# Patient Record
Sex: Female | Born: 1995 | Race: White | Hispanic: No | Marital: Married | Smoking: Former smoker
Health system: Southern US, Community
[De-identification: ages and names within clinical notes are randomized; demographics above are authoritative.]

## PROBLEM LIST (undated history)

## (undated) ENCOUNTER — Inpatient Hospital Stay (HOSPITAL_COMMUNITY): Payer: Self-pay

## (undated) DIAGNOSIS — N926 Irregular menstruation, unspecified: Secondary | ICD-10-CM

## (undated) DIAGNOSIS — O139 Gestational [pregnancy-induced] hypertension without significant proteinuria, unspecified trimester: Secondary | ICD-10-CM

## (undated) DIAGNOSIS — Z789 Other specified health status: Secondary | ICD-10-CM

## (undated) DIAGNOSIS — Z309 Encounter for contraceptive management, unspecified: Secondary | ICD-10-CM

## (undated) DIAGNOSIS — O039 Complete or unspecified spontaneous abortion without complication: Secondary | ICD-10-CM

## (undated) HISTORY — DX: Irregular menstruation, unspecified: N92.6

## (undated) HISTORY — PX: WISDOM TOOTH EXTRACTION: SHX21

## (undated) HISTORY — DX: Gestational (pregnancy-induced) hypertension without significant proteinuria, unspecified trimester: O13.9

## (undated) HISTORY — DX: Complete or unspecified spontaneous abortion without complication: O03.9

## (undated) HISTORY — DX: Other specified health status: Z78.9

## (undated) HISTORY — DX: Encounter for contraceptive management, unspecified: Z30.9

---
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
## 2016-04-20 ENCOUNTER — Ambulatory Visit (INDEPENDENT_AMBULATORY_CARE_PROVIDER_SITE_OTHER): Payer: Self-pay | Admitting: Adult Health

## 2016-04-20 ENCOUNTER — Encounter: Payer: Self-pay | Admitting: Adult Health

## 2016-04-20 VITALS — BP 116/57 | HR 85 | Ht 63.0 in | Wt 141.0 lb

## 2016-04-20 DIAGNOSIS — Z3202 Encounter for pregnancy test, result negative: Secondary | ICD-10-CM

## 2016-04-20 DIAGNOSIS — Z30011 Encounter for initial prescription of contraceptive pills: Secondary | ICD-10-CM

## 2016-04-20 LAB — POCT URINE PREGNANCY: PREG TEST UR: NEGATIVE

## 2016-04-20 MED ORDER — NORGESTIMATE-ETH ESTRADIOL 0.25-35 MG-MCG PO TABS: 1.0000 | ORAL_TABLET | Freq: Every day | ORAL | 11 refills | Status: DC

## 2016-04-20 NOTE — Patient Instructions (Signed)
Start OCs with next period Use condoms Pap and physical in 1 year

## 2016-04-20 NOTE — Progress Notes (Signed)
Subjective:     Patient ID: Erin Watson, female   DOB: [birth year], 21 y.o.   MRN: [id number]  HPI Duwayne HeckDanielle is a 21 year old white female in requesting to get on birth control pills, no complaints, is self pay.  Review of Systems Patient denies any headaches, hearing loss, fatigue, blurred vision, shortness of breath, chest pain, abdominal pain, problems with bowel movements, urination, or intercourse. No joint pain or mood swings. Reviewed past medical,surgical, social and family history. Reviewed medications and allergies.     Objective:   Physical Exam BP (!) 116/57 (BP Location: Left Arm, Patient Position: Sitting, Cuff Size: Small)   Pulse 85   Ht 5\' 3"  (1.6 m)   Wt 141 lb (64 kg)   LMP 03/29/2016   BMI 24.98 kg/m UPT negative, Skin warm and dry. Lungs: clear to ausculation bilaterally. Cardiovascular: regular rate and rhythm.   PHQ 9 score 5, she says she is not depressed just off days occasionally, denies any suicidal ideations.  Assessment:     1. Encounter for initial prescription of contraceptive pills   2. Pregnancy examination or test, negative result       Plan:     Meds ordered this encounter  Medications  . norgestimate-ethinyl estradiol (ORTHO-CYCLEN,SPRINTEC,PREVIFEM) 0.25-35 MG-MCG tablet    Sig: Take 1 tablet by mouth daily.    Dispense:  1 Package    Refill:  11    Order Specific Question:   Supervising Provider    Answer:   Lazaro ArmsEURE, LUTHER H [[id number]]  Use condoms Start OCs with next period Pap and physical in 1 year Try to get family planning medicaid

## 2017-08-15 ENCOUNTER — Other Ambulatory Visit: Payer: Self-pay

## 2017-08-15 ENCOUNTER — Emergency Department (HOSPITAL_COMMUNITY)
Admission: EM | Admit: 2017-08-15 | Discharge: 2017-08-15 | Disposition: A | Discharge status: Home / Self Care | Payer: Self-pay | Attending: Emergency Medicine | Admitting: Emergency Medicine

## 2017-08-15 ENCOUNTER — Emergency Department (HOSPITAL_COMMUNITY): Payer: Self-pay

## 2017-08-15 ENCOUNTER — Encounter (HOSPITAL_COMMUNITY): Payer: Self-pay | Admitting: Emergency Medicine

## 2017-08-15 DIAGNOSIS — R1031 Right lower quadrant pain: Secondary | ICD-10-CM

## 2017-08-15 DIAGNOSIS — N390 Urinary tract infection, site not specified: Secondary | ICD-10-CM

## 2017-08-15 DIAGNOSIS — N83201 Unspecified ovarian cyst, right side: Secondary | ICD-10-CM | POA: Insufficient documentation

## 2017-08-15 DIAGNOSIS — N83209 Unspecified ovarian cyst, unspecified side: Secondary | ICD-10-CM

## 2017-08-15 DIAGNOSIS — O3481 Maternal care for other abnormalities of pelvic organs, first trimester: Secondary | ICD-10-CM | POA: Insufficient documentation

## 2017-08-15 DIAGNOSIS — Z87891 Personal history of nicotine dependence: Secondary | ICD-10-CM | POA: Insufficient documentation

## 2017-08-15 DIAGNOSIS — O2341 Unspecified infection of urinary tract in pregnancy, first trimester: Secondary | ICD-10-CM | POA: Insufficient documentation

## 2017-08-15 DIAGNOSIS — Z3A10 10 weeks gestation of pregnancy: Secondary | ICD-10-CM

## 2017-08-15 LAB — CBC WITH DIFFERENTIAL/PLATELET
BASOS ABS: 0 10*3/uL (ref 0.0–0.1)
BASOS PCT: 0 %
Eosinophils Absolute: 0.1 10*3/uL (ref 0.0–0.7)
Eosinophils Relative: 1 %
HEMATOCRIT: 41.4 % (ref 36.0–46.0)
HEMOGLOBIN: 14 g/dL (ref 12.0–15.0)
Lymphocytes Relative: 19 %
Lymphs Abs: 1.5 10*3/uL (ref 0.7–4.0)
MCH: 29.4 pg (ref 26.0–34.0)
MCHC: 33.8 g/dL (ref 30.0–36.0)
MCV: 87 fL (ref 78.0–100.0)
Monocytes Absolute: 0.4 10*3/uL (ref 0.1–1.0)
Monocytes Relative: 5 %
Neutro Abs: 5.9 10*3/uL (ref 1.7–7.7)
Neutrophils Relative %: 75 %
Platelets: 211 10*3/uL (ref 150–400)
RBC: 4.76 MIL/uL (ref 3.87–5.11)
RDW: 12.4 % (ref 11.5–15.5)
WBC: 7.9 10*3/uL (ref 4.0–10.5)

## 2017-08-15 LAB — BASIC METABOLIC PANEL
Anion gap: 8 (ref 5–15)
BUN: 9 mg/dL (ref 6–20)
CHLORIDE: 103 mmol/L (ref 101–111)
CO2: 25 mmol/L (ref 22–32)
Calcium: 9.3 mg/dL (ref 8.9–10.3)
Creatinine, Ser: 0.53 mg/dL (ref 0.44–1.00)
GFR calc Af Amer: >60 mL/min (ref >60)
GFR calc non Af Amer: >60 mL/min (ref >60)
GLUCOSE: 94 mg/dL (ref 65–99)
POTASSIUM: 3.8 mmol/L (ref 3.5–5.1)
Sodium: 136 mmol/L (ref 135–145)

## 2017-08-15 LAB — HCG, QUANTITATIVE, PREGNANCY: HCG, BETA CHAIN, QUANT, S: 133751 m[IU]/mL — AB (ref <5)

## 2017-08-15 LAB — URINALYSIS, ROUTINE W REFLEX MICROSCOPIC
Bilirubin Urine: NEGATIVE
Glucose, UA: NEGATIVE mg/dL
KETONES UR: NEGATIVE mg/dL
Nitrite: NEGATIVE
PROTEIN: NEGATIVE mg/dL
Specific Gravity, Urine: 1.021 (ref 1.005–1.030)
pH: 5 (ref 5.0–8.0)

## 2017-08-15 MED ORDER — CEPHALEXIN 500 MG PO CAPS: 500.0000 mg | ORAL_CAPSULE | Freq: Four times a day (QID) | ORAL | 0 refills | Status: DC

## 2017-08-15 NOTE — ED Provider Notes (Signed)
[facility] EMERGENCY DEPARTMENT Provider Note   CSN: [id number] Arrival date & time: 08/15/17  4098     History   Chief Complaint Chief Complaint  Patient presents with  . Abdominal Pain    HPI Erin Watson is a 22 y.o. female.  The history is provided by the patient. No language interpreter was used.  Abdominal Pain   This is a new problem. The current episode started yesterday. The problem occurs constantly. The problem has been rapidly worsening. The pain is located in the RLQ. The quality of the pain is aching. The pain is moderate. Associated symptoms include nausea. Nothing aggravates the symptoms. Nothing relieves the symptoms. Past workup does not include surgery. Her past medical history does not include irritable bowel syndrome.   Pt reports she has soreness in her right lower abdomen.  Pt is [redacted] weeks pregnant.  She is on prenatal vitamins  Past Medical History:  Diagnosis Date  . Contraceptive management 12/28/2013  . Irregular bleeding 12/28/2013  . Medical history non-contributory   . Miscarriage     Patient Active Problem List   Diagnosis Date Noted  . Irregular bleeding 12/28/2013  . Contraceptive management 12/28/2013  . Complete miscarriage 07/05/2013  . Spontaneous miscarriage 07/05/2013  . Rubella non-immune status, antepartum 06/18/2013  . Vaginal bleeding in pregnant patient at less than [redacted] weeks gestation 06/18/2013  . Supervision of normal first teen pregnancy 05/08/2013  . Teen pregnancy 05/08/2013    Past Surgical History:  Procedure Laterality Date  . NO PAST SURGERIES       OB History    Gravida  2   Para  0   Term      Preterm      AB  1   Living  0     SAB  1   TAB      Ectopic      Multiple      Live Births               Home Medications    Prior to Admission medications   Medication Sig Start Date End Date Taking? Authorizing Provider  Prenatal Vit-Fe Fumarate-FA (MULTIVITAMIN-PRENATAL) 27-0.8 MG TABS  tablet Take 1 tablet by mouth daily at 12 noon.   Yes [provider]  cephALEXin (KEFLEX) 500 MG capsule Take 1 capsule (500 mg total) by mouth 4 (four) times daily. 08/15/17   Elson Areas, PA-C    Family History Family History  Problem Relation Age of Onset  . Hypertension Father   . Cancer Maternal Aunt        skin  . Diabetes Maternal Grandmother   . Diabetes Paternal Grandmother   . Heart disease Paternal Grandmother   . Cancer Paternal Grandfather     Social History Social History   Tobacco Use  . Smoking status: Former Smoker    Types: Cigarettes  . Smokeless tobacco: Never Used  Substance Use Topics  . Alcohol use: No  . Drug use: No     Allergies   Patient has no known allergies.   Review of Systems Review of Systems  Gastrointestinal: Positive for abdominal pain and nausea.  All other systems reviewed and are negative.    Physical Exam Updated Vital Signs BP 128/66   Pulse 87   Temp 98 F (36.7 C)   Resp 18   Ht  (1.6 m)   Wt 59 kg (130 lb)   SpO2 100%   BMI 23.03  kg/m   Physical Exam  Constitutional: She appears well-developed and well-nourished.  HENT:  Head: Normocephalic.  Mouth/Throat: Oropharynx is clear and moist.  Eyes: Pupils are equal, round, and reactive to light. EOM are normal.  Cardiovascular: Normal rate, regular rhythm and normal heart sounds.  Pulmonary/Chest: Effort normal and breath sounds normal.  Abdominal: Normal appearance and bowel sounds are normal.  Neurological: She is alert.  Skin: Skin is warm.  Psychiatric: She has a normal mood and affect.  Nursing note and vitals reviewed.    ED Treatments / Results  Labs (all labs ordered are listed, but only abnormal results are displayed) Labs Reviewed  URINALYSIS, ROUTINE W REFLEX MICROSCOPIC - Abnormal; Notable for the following components:      Result Value   APPearance CLOUDY (*)    Hgb urine dipstick MODERATE (*)    Leukocytes, UA LARGE (*)      Bacteria, UA RARE (*)    All other components within normal limits  HCG, QUANTITATIVE, PREGNANCY - Abnormal; Notable for the following components:   hCG, Beta Chain, Quant, S 133,751 (*)    All other components within normal limits  CBC WITH DIFFERENTIAL/PLATELET  BASIC METABOLIC PANEL    EKG None  Radiology US Ob Comp Less 14 Wks  Result Date: 08/15/2017 CLINICAL DATA:  Right lower quadrant pain EXAM: OBSTETRIC <14 WK [place] AND TRANSVAGINAL OB US TECHNIQUE: Both transabdominal and transvaginal ultrasound examinations were performed for complete evaluation of the gestation as well as the maternal uterus, adnexal regions, and pelvic cul-de-sac. Transvaginal technique was performed to assess early pregnancy. COMPARISON:  None. FINDINGS: Intrauterine gestational sac: Single Yolk sac:  Visualized. Embryo:  Visualized. Cardiac Activity: Visualized. Heart Rate: 163 bpm MSD:   mm    w     d CRL:  31.2 mm   10 w   0 d                  [place] EDC: March 13, 2018 Subchorionic hemorrhage:  None visualized. Maternal uterus/adnexae: Corpus luteum cyst on the right. Left ovary not visualized. IMPRESSION: 1. Single live IUP. 2. Corpus luteum cyst in the right ovary. Electronically Signed   By: Gerome Sam III M.D   On: 08/15/2017 10:13   US Ob Transvaginal  Result Date: 08/15/2017 CLINICAL DATA:  Right lower quadrant pain EXAM: OBSTETRIC <14 WK [place] AND TRANSVAGINAL OB US TECHNIQUE: Both transabdominal and transvaginal ultrasound examinations were performed for complete evaluation of the gestation as well as the maternal uterus, adnexal regions, and pelvic cul-de-sac. Transvaginal technique was performed to assess early pregnancy. COMPARISON:  None. FINDINGS: Intrauterine gestational sac: Single Yolk sac:  Visualized. Embryo:  Visualized. Cardiac Activity: Visualized. Heart Rate: 163 bpm MSD:   mm    w     d CRL:  31.2 mm   10 w   0 d                  [place] EDC: March 13, 2018 Subchorionic hemorrhage:  None  visualized. Maternal uterus/adnexae: Corpus luteum cyst on the right. Left ovary not visualized. IMPRESSION: 1. Single live IUP. 2. Corpus luteum cyst in the right ovary. Electronically Signed   By: Gerome Sam III M.D   On: 08/15/2017 10:13    Procedures Procedures (including critical care time)  Medications Ordered in ED Medications - No data to display   Initial Impression / Assessment and Plan / ED Course  I have reviewed the triage vital signs  and the nursing notes.  Pertinent labs & imaging results that were available during my care of the patient were reviewed by me and considered in my medical decision making (see chart for details).     MDM  Pt counseled on possible urinary tract infection. Pt placed on keflex.  Ultrasound shows iup with ovarian cyst.  Pt counseled on cyst  Final Clinical Impressions(s) / ED Diagnoses   Final diagnoses:  [redacted] weeks gestation of pregnancy  Urinary tract infection without hematuria, site unspecified  Ovarian cyst during pregnancy in first trimester    ED Discharge Orders        Ordered    cephALEXin (KEFLEX) 500 MG capsule  4 times daily     08/15/17 1038    An After Visit Summary was printed and given to the patient.    Elson Areas, Cordelia Poche 08/15/17 1157    Donnetta Hutching, MD 08/15/17 1515

## 2017-08-15 NOTE — ED Triage Notes (Signed)
Pt state she was using the bathroom last night and felt a "pulling" sensation in her lower abdomen. Pt is [redacted] wks pregnant.  Denies vaginal bleeding or discharge

## 2017-08-15 NOTE — Discharge Instructions (Signed)
Follow up with your Physician for recheck  

## 2017-08-15 NOTE — ED Notes (Signed)
Patient transported to Ultrasound 

## 2017-08-22 ENCOUNTER — Encounter: Payer: Self-pay | Admitting: Women's Health

## 2017-08-22 ENCOUNTER — Ambulatory Visit (INDEPENDENT_AMBULATORY_CARE_PROVIDER_SITE_OTHER): Payer: Self-pay | Admitting: Women's Health

## 2017-08-22 VITALS — BP 110/80 | HR 98 | Temp 98.3°F | Wt 129.0 lb

## 2017-08-22 DIAGNOSIS — Z331 Pregnant state, incidental: Secondary | ICD-10-CM

## 2017-08-22 DIAGNOSIS — O2341 Unspecified infection of urinary tract in pregnancy, first trimester: Secondary | ICD-10-CM

## 2017-08-22 DIAGNOSIS — O2301 Infections of kidney in pregnancy, first trimester: Secondary | ICD-10-CM | POA: Insufficient documentation

## 2017-08-22 DIAGNOSIS — Z1389 Encounter for screening for other disorder: Secondary | ICD-10-CM

## 2017-08-22 DIAGNOSIS — N390 Urinary tract infection, site not specified: Secondary | ICD-10-CM

## 2017-08-22 LAB — POCT URINALYSIS DIPSTICK
GLUCOSE UA: NEGATIVE
Ketones, UA: NEGATIVE
Leukocytes, UA: NEGATIVE
Nitrite, UA: NEGATIVE
Protein, UA: NEGATIVE

## 2017-08-22 NOTE — Progress Notes (Signed)
   GYN VISIT Patient name: Erin Watson MRN [id number]  Date of birth: January 10, 1996 Chief Complaint:   follow-up ER (has uti/ ovarian cyst/ taking keflex)  History of Present Illness:   Erin Watson is a 22 y.o. G26P0010 Caucasian female at [redacted]w[redacted]d being seen today for f/u from ED for UTI. Tx for presumed UTI at [facility] on 08/15/17, rx'd keflex x 10d, no urine cx sent. U/S 08/15/17: CRL c/w [redacted]w[redacted]d w/ Rt CL cyst. Feeling better, still some mild Rt back pain. Denies fever/chills, n/v, etc. Taking pnv daily. Some heartburn. Doesn't have new ob visit scheduled yet, currently self-pay, didn't qualify for preg [facility]. Denies any other meds, etoh, illicit drugs, etc.   No LMP recorded. Patient is pregnant. Review of Systems:   Pertinent items are noted in HPI Denies fever/chills, dizziness, headaches, visual disturbances, fatigue, shortness of breath, chest pain, abdominal pain, vomiting, abnormal vaginal discharge/itching/odor/irritation, problems with periods, bowel movements, urination, or intercourse unless otherwise stated above.  Pertinent History Reviewed:  Reviewed past medical,surgical, social, obstetrical and family history.  Reviewed problem list, medications and allergies. Physical Assessment:   Vitals:   08/22/17 0942  BP: 110/80  Pulse: 98  Temp: 98.3 F (36.8 C)  Weight: 129 lb (58.5 kg)  Body mass index is 22.85 kg/m.       Physical Examination:   General appearance: alert, well appearing, and in no distress  Mental status: alert, oriented to person, place, and time  Skin: warm & dry   Cardiovascular: normal heart rate noted  Respiratory: normal respiratory effort, no distress  Abdomen: soft, non-tender   Back: mild Rt CVAT   Pelvic: normal external genitalia, vulva, vagina, cervix, uterus and adnexa, examination not indicated  Extremities: no edema   Results for orders placed or performed in visit on 08/22/17 (from the past 24 hour(s))  POCT urinalysis dipstick   Collection  Time: 08/22/17  9:48 AM  Result Value Ref Range   Color, UA     Clarity, UA     Glucose, UA neg    Bilirubin, UA     Ketones, UA neg    Spec Grav, UA  1.010 - 1.025   Blood, UA small    pH, UA  5.0 - 8.0   Protein, UA neg    Urobilinogen, UA  0.2 or 1.0 E.U./dL   Nitrite, UA neg    Leukocytes, UA Negative Negative   Appearance     Odor      Assessment & Plan:  1) Resolving presumed Rt pyelonephritis> afebrile, appears well, continue keflex as rx'd, send urine cx today, discussed warning s/s, reasons to seek care  Meds: No orders of the defined types were placed in this encounter.   Orders Placed This Encounter  Procedures  . Urine Culture  . POCT urinalysis dipstick    Return for needs new ob visit scheduled.  Cheral Marker CNM, [facility] 08/22/2017 10:05 AM

## 2017-08-22 NOTE — Patient Instructions (Signed)
Erin Watson, I greatly value your feedback.  If you receive a survey following your visit with [place] today, we appreciate you taking the time to fill it out.  Thanks, Joellyn Haff, CNM, WHNP-BC   Nausea & Vomiting  Have saltine crackers or pretzels by your bed and eat a few bites before you raise your head out of bed in the morning  Eat small frequent meals throughout the day instead of large meals  Drink plenty of fluids throughout the day to stay hydrated, just don't drink a lot of fluids with your meals.  This can make your stomach fill up faster making you feel sick  Do not brush your teeth right after you eat  Products with real ginger are good for nausea, like ginger ale and ginger hard candy Make sure it says made with real ginger!  Sucking on sour candy like lemon heads is also good for nausea  If your prenatal vitamins make you nauseated, take them at night so you will sleep through the nausea  Sea Bands  If you feel like you need medicine for the nausea & vomiting please let us know  If you are unable to keep any fluids or food down please let us know   Constipation  Drink plenty of fluid, preferably water, throughout the day  Eat foods high in fiber such as fruits, vegetables, and grains  Exercise, such as walking, is a good way to keep your bowels regular  Drink warm fluids, especially warm prune juice, or decaf coffee  Eat a 1/2 cup of real oatmeal (not instant), 1/2 cup applesauce, and 1/2-1 cup warm prune juice every day  If needed, you may take Colace (docusate sodium) stool softener once or twice a day to help keep the stool soft. If you are pregnant, wait until you are out of your first trimester (12-14 weeks of pregnancy)  If you still are having problems with constipation, you may take Miralax once daily as needed to help keep your bowels regular.  If you are pregnant, wait until you are out of your first trimester (12-14 weeks of pregnancy)   First  Trimester of Pregnancy The first trimester of pregnancy is from week 1 until the end of week 12 (months 1 through 3). A week after a sperm fertilizes an egg, the egg will implant on the wall of the uterus. This embryo will begin to develop into a baby. Genes from you and your partner are forming the baby. The female genes determine whether the baby is a boy or a girl. At 6-8 weeks, the eyes and face are formed, and the heartbeat can be seen on ultrasound. At the end of 12 weeks, all the baby's organs are formed.  Now that you are pregnant, you will want to do everything you can to have a healthy baby. Two of the most important things are to get good prenatal care and to follow your health care provider's instructions. Prenatal care is all the medical care you receive before the baby's birth. This care will help prevent, find, and treat any problems during the pregnancy and childbirth. BODY CHANGES Your body goes through many changes during pregnancy. The changes vary from woman to woman.   You may gain or lose a couple of pounds at first.  You may feel sick to your stomach (nauseous) and throw up (vomit). If the vomiting is uncontrollable, call your health care provider.  You may tire easily.  You may develop headaches  that can be relieved by medicines approved by your health care provider.  You may urinate more often. Painful urination may mean you have a bladder infection.  You may develop heartburn as a result of your pregnancy.  You may develop constipation because certain hormones are causing the muscles that push waste through your intestines to slow down.  You may develop hemorrhoids or swollen, bulging veins (varicose veins).  Your breasts may begin to grow larger and become tender. Your nipples may stick out more, and the tissue that surrounds them (areola) may become darker.  Your gums may bleed and may be sensitive to brushing and flossing.  Dark spots or blotches (chloasma, mask  of pregnancy) may develop on your face. This will likely fade after the baby is born.  Your menstrual periods will stop.  You may have a loss of appetite.  You may develop cravings for certain kinds of food.  You may have changes in your emotions from day to day, such as being excited to be pregnant or being concerned that something may go wrong with the pregnancy and baby.  You may have more vivid and strange dreams.  You may have changes in your hair. These can include thickening of your hair, rapid growth, and changes in texture. Some women also have hair loss during or after pregnancy, or hair that feels dry or thin. Your hair will most likely return to normal after your baby is born. WHAT TO EXPECT AT YOUR PRENATAL VISITS During a routine prenatal visit:  You will be weighed to make sure you and the baby are growing normally.  Your blood pressure will be taken.  Your abdomen will be measured to track your baby's growth.  The fetal heartbeat will be listened to starting around week 10 or 12 of your pregnancy.  Test results from any previous visits will be discussed. Your health care provider may ask you:  How you are feeling.  If you are feeling the baby move.  If you have had any abnormal symptoms, such as leaking fluid, bleeding, severe headaches, or abdominal cramping.  If you have any questions. Other tests that may be performed during your first trimester include:  Blood tests to find your blood type and to check for the presence of any previous infections. They will also be used to check for low iron levels (anemia) and Rh antibodies. Later in the pregnancy, blood tests for diabetes will be done along with other tests if problems develop.  Urine tests to check for infections, diabetes, or protein in the urine.  An ultrasound to confirm the proper growth and development of the baby.  An amniocentesis to check for possible genetic problems.  Fetal screens for spina  bifida and Down syndrome.  You may need other tests to make sure you and the baby are doing well. HOME CARE INSTRUCTIONS  Medicines  Follow your health care provider's instructions regarding medicine use. Specific medicines may be either safe or unsafe to take during pregnancy.  Take your prenatal vitamins as directed.  If you develop constipation, try taking a stool softener if your health care provider approves. Diet  Eat regular, well-balanced meals. Choose a variety of foods, such as meat or vegetable-based protein, fish, milk and low-fat dairy products, vegetables, fruits, and whole grain breads and cereals. Your health care provider will help you determine the amount of weight gain that is right for you.  Avoid raw meat and uncooked cheese. These carry germs that can  cause birth defects in the baby.  Eating four or five small meals rather than three large meals a day may help relieve nausea and vomiting. If you start to feel nauseous, eating a few soda crackers can be helpful. Drinking liquids between meals instead of during meals also seems to help nausea and vomiting.  If you develop constipation, eat more high-fiber foods, such as fresh vegetables or fruit and whole grains. Drink enough fluids to keep your urine clear or pale yellow. Activity and Exercise  Exercise only as directed by your health care provider. Exercising will help you:  Control your weight.  Stay in shape.  Be prepared for labor and delivery.  Experiencing pain or cramping in the lower abdomen or low back is a good sign that you should stop exercising. Check with your health care provider before continuing normal exercises.  Try to avoid standing for long periods of time. Move your legs often if you must stand in one place for a long time.  Avoid heavy lifting.  Wear low-heeled shoes, and practice good posture.  You may continue to have sex unless your health care provider directs you  otherwise. Relief of Pain or Discomfort  Wear a good support bra for breast tenderness.   Take warm sitz baths to soothe any pain or discomfort caused by hemorrhoids. Use hemorrhoid cream if your health care provider approves.   Rest with your legs elevated if you have leg cramps or low back pain.  If you develop varicose veins in your legs, wear support hose. Elevate your feet for 15 minutes, 3-4 times a day. Limit salt in your diet. Prenatal Care  Schedule your prenatal visits by the twelfth week of pregnancy. They are usually scheduled monthly at first, then more often in the last 2 months before delivery.  Write down your questions. Take them to your prenatal visits.  Keep all your prenatal visits as directed by your health care provider. Safety  Wear your seat belt at all times when driving.  Make a list of emergency phone numbers, including numbers for family, friends, the hospital, and police and fire departments. General Tips  Ask your health care provider for a referral to a local prenatal education class. Begin classes no later than at the beginning of month 6 of your pregnancy.  Ask for help if you have counseling or nutritional needs during pregnancy. Your health care provider can offer advice or refer you to specialists for help with various needs.  Do not use hot tubs, steam rooms, or saunas.  Do not douche or use tampons or scented sanitary pads.  Do not cross your legs for long periods of time.  Avoid cat litter boxes and soil used by cats. These carry germs that can cause birth defects in the baby and possibly loss of the fetus by miscarriage or stillbirth.  Avoid all smoking, herbs, alcohol, and medicines not prescribed by your health care provider. Chemicals in these affect the formation and growth of the baby.  Schedule a dentist appointment. At home, brush your teeth with a soft toothbrush and be gentle when you floss. SEEK MEDICAL CARE IF:   You have  dizziness.  You have mild pelvic cramps, pelvic pressure, or nagging pain in the abdominal area.  You have persistent nausea, vomiting, or diarrhea.  You have a bad smelling vaginal discharge.  You have pain with urination.  You notice increased swelling in your face, hands, legs, or ankles. SEEK IMMEDIATE MEDICAL CARE IF:  You have a fever.  You are leaking fluid from your vagina.  You have spotting or bleeding from your vagina.  You have severe abdominal cramping or pain.  You have rapid weight gain or loss.  You vomit blood or material that looks like coffee grounds.  You are exposed to [place] measles and have never had them.  You are exposed to fifth disease or chickenpox.  You develop a severe headache.  You have shortness of breath.  You have any kind of trauma, such as from a fall or a car accident. Document Released: 03/23/2001 Document Revised: 08/13/2013 Document Reviewed: 02/06/2013 [facility] Patient Information 2015 [place], [place]. This information is not intended to replace advice given to you by your health care provider. Make sure you discuss any questions you have with your health care provider.

## 2017-08-24 LAB — URINE CULTURE: Organism ID, Bacteria: NO GROWTH

## 2017-09-08 ENCOUNTER — Ambulatory Visit (INDEPENDENT_AMBULATORY_CARE_PROVIDER_SITE_OTHER): Payer: Self-pay | Admitting: Advanced Practice Midwife

## 2017-09-08 ENCOUNTER — Ambulatory Visit: Payer: Self-pay | Admitting: *Deleted

## 2017-09-08 ENCOUNTER — Other Ambulatory Visit (HOSPITAL_COMMUNITY)
Admission: RE | Admit: 2017-09-08 | Discharge: 2017-09-08 | Disposition: A | Discharge status: Home / Self Care | Payer: Medicaid Other | Source: Ambulatory Visit | Attending: Advanced Practice Midwife | Admitting: Advanced Practice Midwife

## 2017-09-08 ENCOUNTER — Encounter: Payer: Self-pay | Admitting: Advanced Practice Midwife

## 2017-09-08 VITALS — BP 107/67 | HR 67 | Wt 125.0 lb

## 2017-09-08 DIAGNOSIS — Z3480 Encounter for supervision of other normal pregnancy, unspecified trimester: Secondary | ICD-10-CM | POA: Insufficient documentation

## 2017-09-08 DIAGNOSIS — O09291 Supervision of pregnancy with other poor reproductive or obstetric history, first trimester: Secondary | ICD-10-CM

## 2017-09-08 DIAGNOSIS — O0990 Supervision of high risk pregnancy, unspecified, unspecified trimester: Secondary | ICD-10-CM | POA: Insufficient documentation

## 2017-09-08 DIAGNOSIS — Z789 Other specified health status: Secondary | ICD-10-CM

## 2017-09-08 DIAGNOSIS — Z331 Pregnant state, incidental: Secondary | ICD-10-CM

## 2017-09-08 DIAGNOSIS — Z1389 Encounter for screening for other disorder: Secondary | ICD-10-CM

## 2017-09-08 DIAGNOSIS — Z3482 Encounter for supervision of other normal pregnancy, second trimester: Secondary | ICD-10-CM

## 2017-09-08 DIAGNOSIS — Z3A13 13 weeks gestation of pregnancy: Secondary | ICD-10-CM

## 2017-09-08 DIAGNOSIS — Z3481 Encounter for supervision of other normal pregnancy, first trimester: Secondary | ICD-10-CM

## 2017-09-08 DIAGNOSIS — Z363 Encounter for antenatal screening for malformations: Secondary | ICD-10-CM

## 2017-09-08 DIAGNOSIS — O2301 Infections of kidney in pregnancy, first trimester: Secondary | ICD-10-CM

## 2017-09-08 DIAGNOSIS — N12 Tubulo-interstitial nephritis, not specified as acute or chronic: Secondary | ICD-10-CM

## 2017-09-08 DIAGNOSIS — Z124 Encounter for screening for malignant neoplasm of cervix: Secondary | ICD-10-CM

## 2017-09-08 LAB — POCT URINALYSIS DIPSTICK
Blood, UA: NEGATIVE
Glucose, UA: NEGATIVE
Ketones, UA: NEGATIVE
Leukocytes, UA: NEGATIVE
Nitrite, UA: NEGATIVE
PROTEIN UA: NEGATIVE

## 2017-09-08 NOTE — Progress Notes (Signed)
  Subjective:    Erin Watson is a G2P0010 [redacted]w[redacted]d being seen today for her first obstetrical visit.  Her obstetrical history is significant for 17 week SAB following large SCH.  Pregnancy history fully reviewed.  Patient reports no complaints.  There were no vitals filed for this visit.  HISTORY: OB History  Gravida Para Term Preterm AB Living  2 0     1 0  SAB TAB Ectopic Multiple Live Births  1            # Outcome Date GA Lbr Len/2nd Weight Sex Delivery Anes PTL Lv  2 Current           1 SAB 07/05/13 [redacted]w[redacted]d     None  FD   Past Medical History:  Diagnosis Date  . Contraceptive management 12/28/2013  . Irregular bleeding 12/28/2013  . Medical history non-contributory   . Miscarriage    Past Surgical History:  Procedure Laterality Date  . WISDOM TOOTH EXTRACTION     Family History  Problem Relation Age of Onset  . Hypertension Father   . Cancer Maternal Aunt        skin  . Diabetes Maternal Grandmother   . Diabetes Paternal Grandmother   . Heart disease Paternal Grandmother   . Cancer Paternal Grandfather      Exam       Pelvic Exam:    Perineum: Normal Perineum   Vulva: normal   Vagina:  normal mucosa, normal discharge, no palpable nodules   Uterus Normal, Gravid, FH: 14     Cervix: normal   Adnexa: Not palpable   Urinary:  urethral meatus normal    System:     Skin: normal coloration and turgor, no rashes    Neurologic: oriented, normal, normal mood   Extremities: normal strength, tone, and muscle mass   HEENT PERRLA   Mouth/Teeth mucous membranes moist, normal dentition   Neck supple and no masses   Cardiovascular: regular rate and rhythm   Respiratory:  appears well, vitals normal, no respiratory distress, acyanotic   Abdomen: soft, non-tender;  FHR: 160        The nature of [facility] with multiple MDs and other Advanced Practice Providers was explained to patient; also emphasized that residents, students  are part of our team.  Assessment:    Pregnancy: G2P0010 Patient Active Problem List   Diagnosis Date Noted  . Supervision of normal pregnancy 09/08/2017  . Pyelonephritis affecting pregnancy in first trimester 08/22/2017  . Irregular bleeding 12/28/2013  . Spontaneous miscarriage 07/05/2013  . Rubella non-immune status, antepartum 06/18/2013        Plan:     Initial labs drawn. Continue prenatal vitamins  Problem list reviewed and updated  Reviewed n/v relief measures and warning s/s to report  Reviewed recommended weight gain based on pre-gravid BMI  Encouraged well-balanced diet Genetic Screening discussed Integrated Screen: declined.  Ultrasound discussed; fetal survey: requested.  Return in about 1 month (around 10/07/2017) for LROB, [id number].  [name] 09/08/2017

## 2017-09-08 NOTE — Patient Instructions (Signed)
 First Trimester of Pregnancy The first trimester of pregnancy is from week 1 until the end of week 12 (months 1 through 3). A week after a sperm fertilizes an egg, the egg will implant on the wall of the uterus. This embryo will begin to develop into a baby. Genes from you and your partner are forming the baby. The female genes determine whether the baby is a boy or a girl. At 6-8 weeks, the eyes and face are formed, and the heartbeat can be seen on ultrasound. At the end of 12 weeks, all the baby's organs are formed.  Now that you are pregnant, you will want to do everything you can to have a healthy baby. Two of the most important things are to get good prenatal care and to follow your health care provider's instructions. Prenatal care is all the medical care you receive before the baby's birth. This care will help prevent, find, and treat any problems during the pregnancy and childbirth. BODY CHANGES Your body goes through many changes during pregnancy. The changes vary from woman to woman.   You may gain or lose a couple of pounds at first.  You may feel sick to your stomach (nauseous) and throw up (vomit). If the vomiting is uncontrollable, call your health care provider.  You may tire easily.  You may develop headaches that can be relieved by medicines approved by your health care provider.  You may urinate more often. Painful urination may mean you have a bladder infection.  You may develop heartburn as a result of your pregnancy.  You may develop constipation because certain hormones are causing the muscles that push waste through your intestines to slow down.  You may develop hemorrhoids or swollen, bulging veins (varicose veins).  Your breasts may begin to grow larger and become tender. Your nipples may stick out more, and the tissue that surrounds them (areola) may become darker.  Your gums may bleed and may be sensitive to brushing and flossing.  Dark spots or blotches  (chloasma, mask of pregnancy) may develop on your face. This will likely fade after the baby is born.  Your menstrual periods will stop.  You may have a loss of appetite.  You may develop cravings for certain kinds of food.  You may have changes in your emotions from day to day, such as being excited to be pregnant or being concerned that something may go wrong with the pregnancy and baby.  You may have more vivid and strange dreams.  You may have changes in your hair. These can include thickening of your hair, rapid growth, and changes in texture. Some women also have hair loss during or after pregnancy, or hair that feels dry or thin. Your hair will most likely return to normal after your baby is born. WHAT TO EXPECT AT YOUR PRENATAL VISITS During a routine prenatal visit:  You will be weighed to make sure you and the baby are growing normally.  Your blood pressure will be taken.  Your abdomen will be measured to track your baby's growth.  The fetal heartbeat will be listened to starting around week 10 or 12 of your pregnancy.  Test results from any previous visits will be discussed. Your health care provider may ask you:  How you are feeling.  If you are feeling the baby move.  If you have had any abnormal symptoms, such as leaking fluid, bleeding, severe headaches, or abdominal cramping.  If you have any questions. Other   tests that may be performed during your first trimester include:  Blood tests to find your blood type and to check for the presence of any previous infections. They will also be used to check for low iron levels (anemia) and Rh antibodies. Later in the pregnancy, blood tests for diabetes will be done along with other tests if problems develop.  Urine tests to check for infections, diabetes, or protein in the urine.  An ultrasound to confirm the proper growth and development of the baby.  An amniocentesis to check for possible genetic problems.  Fetal  screens for spina bifida and Down syndrome.  You may need other tests to make sure you and the baby are doing well. HOME CARE INSTRUCTIONS  Medicines  Follow your health care provider's instructions regarding medicine use. Specific medicines may be either safe or unsafe to take during pregnancy.  Take your prenatal vitamins as directed.  If you develop constipation, try taking a stool softener if your health care provider approves. Diet  Eat regular, well-balanced meals. Choose a variety of foods, such as meat or vegetable-based protein, fish, milk and low-fat dairy products, vegetables, fruits, and whole grain breads and cereals. Your health care provider will help you determine the amount of weight gain that is right for you.  Avoid raw meat and uncooked cheese. These carry germs that can cause birth defects in the baby.  Eating four or five small meals rather than three large meals a day may help relieve nausea and vomiting. If you start to feel nauseous, eating a few soda crackers can be helpful. Drinking liquids between meals instead of during meals also seems to help nausea and vomiting.  If you develop constipation, eat more high-fiber foods, such as fresh vegetables or fruit and whole grains. Drink enough fluids to keep your urine clear or pale yellow. Activity and Exercise  Exercise only as directed by your health care provider. Exercising will help you:  Control your weight.  Stay in shape.  Be prepared for labor and delivery.  Experiencing pain or cramping in the lower abdomen or low back is a good sign that you should stop exercising. Check with your health care provider before continuing normal exercises.  Try to avoid standing for long periods of time. Move your legs often if you must stand in one place for a long time.  Avoid heavy lifting.  Wear low-heeled shoes, and practice good posture.  You may continue to have sex unless your health care provider directs you  otherwise. Relief of Pain or Discomfort  Wear a good support bra for breast tenderness.   Take warm sitz baths to soothe any pain or discomfort caused by hemorrhoids. Use hemorrhoid cream if your health care provider approves.   Rest with your legs elevated if you have leg cramps or low back pain.  If you develop varicose veins in your legs, wear support hose. Elevate your feet for 15 minutes, 3-4 times a day. Limit salt in your diet. Prenatal Care  Schedule your prenatal visits by the twelfth week of pregnancy. They are usually scheduled monthly at first, then more often in the last 2 months before delivery.  Write down your questions. Take them to your prenatal visits.  Keep all your prenatal visits as directed by your health care provider. Safety  Wear your seat belt at all times when driving.  Make a list of emergency phone numbers, including numbers for family, friends, the hospital, and police and fire departments. General   Tips  Ask your health care provider for a referral to a local prenatal education class. Begin classes no later than at the beginning of month 6 of your pregnancy.  Ask for help if you have counseling or nutritional needs during pregnancy. Your health care provider can offer advice or refer you to specialists for help with various needs.  Do not use hot tubs, steam rooms, or saunas.  Do not douche or use tampons or scented sanitary pads.  Do not cross your legs for long periods of time.  Avoid cat litter boxes and soil used by cats. These carry germs that can cause birth defects in the baby and possibly loss of the fetus by miscarriage or stillbirth.  Avoid all smoking, herbs, alcohol, and medicines not prescribed by your health care provider. Chemicals in these affect the formation and growth of the baby.  Schedule a dentist appointment. At home, brush your teeth with a soft toothbrush and be gentle when you floss. SEEK MEDICAL CARE IF:   You have  dizziness.  You have mild pelvic cramps, pelvic pressure, or nagging pain in the abdominal area.  You have persistent nausea, vomiting, or diarrhea.  You have a bad smelling vaginal discharge.  You have pain with urination.  You notice increased swelling in your face, hands, legs, or ankles. SEEK IMMEDIATE MEDICAL CARE IF:   You have a fever.  You are leaking fluid from your vagina.  You have spotting or bleeding from your vagina.  You have severe abdominal cramping or pain.  You have rapid weight gain or loss.  You vomit blood or material that looks like coffee grounds.  You are exposed to German measles and have never had them.  You are exposed to fifth disease or chickenpox.  You develop a severe headache.  You have shortness of breath.  You have any kind of trauma, such as from a fall or a car accident. Document Released: 03/23/2001 Document Revised: 08/13/2013 Document Reviewed: 02/06/2013 [employer] Patient Information 2015 [employer]. This information is not intended to replace advice given to you by your health care provider. Make sure you discuss any questions you have with your health care provider.   Nausea & Vomiting  Have saltine crackers or pretzels by your bed and eat a few bites before you raise your head out of bed in the morning  Eat small frequent meals throughout the day instead of large meals  Drink plenty of fluids throughout the day to stay hydrated, just don't drink a lot of fluids with your meals.  This can make your stomach fill up faster making you feel sick  Do not brush your teeth right after you eat  Products with real ginger are good for nausea, like ginger ale and ginger hard candy Make sure it says made with real ginger!  Sucking on sour candy like lemon heads is also good for nausea  If your prenatal vitamins make you nauseated, take them at night so you will sleep through the nausea  Sea Bands  If you feel like you need  medicine for the nausea & vomiting please let us know  If you are unable to keep any fluids or food down please let us know   Constipation  Drink plenty of fluid, preferably water, throughout the day  Eat foods high in fiber such as fruits, vegetables, and grains  Exercise, such as walking, is a good way to keep your bowels regular  Drink warm fluids, especially warm   prune juice, or decaf coffee  Eat a 1/2 cup of real oatmeal (not instant), 1/2 cup applesauce, and 1/2-1 cup warm prune juice every day  If needed, you may take Colace (docusate sodium) stool softener once or twice a day to help keep the stool soft. If you are pregnant, wait until you are out of your first trimester (12-14 weeks of pregnancy)  If you still are having problems with constipation, you may take Miralax once daily as needed to help keep your bowels regular.  If you are pregnant, wait until you are out of your first trimester (12-14 weeks of pregnancy)  Safe Medications in Pregnancy   Acne: Benzoyl Peroxide Salicylic Acid  Backache/Headache: Tylenol: 2 regular strength every 4 hours OR              2 Extra strength every 6 hours  Colds/Coughs/Allergies: Benadryl (alcohol free) 25 mg every 6 hours as needed Breath right strips Claritin Cepacol throat lozenges Chloraseptic throat spray Cold-Eeze- up to three times per day Cough drops, alcohol free Flonase (by prescription only) Guaifenesin Mucinex Robitussin DM (plain only, alcohol free) Saline nasal spray/drops Sudafed (pseudoephedrine) & Actifed ** use only after [redacted] weeks gestation and if you do not have high blood pressure Tylenol Vicks Vaporub Zinc lozenges Zyrtec   Constipation: Colace Ducolax suppositories Fleet enema Glycerin suppositories Metamucil Milk of magnesia Miralax Senokot Smooth move tea  Diarrhea: Kaopectate Imodium A-D  *NO pepto Bismol  Hemorrhoids: Anusol Anusol HC Preparation  H Tucks  Indigestion: Tums Maalox Mylanta Zantac  Pepcid  Insomnia: Benadryl (alcohol free) 25mg every 6 hours as needed Tylenol PM Unisom, no Gelcaps  Leg Cramps: Tums MagGel  Nausea/Vomiting:  Bonine Dramamine Emetrol Ginger extract Sea bands Meclizine  Nausea medication to take during pregnancy:  Unisom (doxylamine succinate 25 mg tablets) Take one tablet daily at bedtime. If symptoms are not adequately controlled, the dose can be increased to a maximum recommended dose of two tablets daily (1/2 tablet in the morning, 1/2 tablet mid-afternoon and one at bedtime). Vitamin B6 100mg tablets. Take one tablet twice a day (up to 200 mg per day).  Skin Rashes: Aveeno products Benadryl cream or 25mg every 6 hours as needed Calamine Lotion 1% cortisone cream  Yeast infection: Gyne-lotrimin 7 Monistat 7   **If taking multiple medications, please check labels to avoid duplicating the same active ingredients **take medication as directed on the label ** Do not exceed 4000 mg of tylenol in 24 hours **Do not take medications that contain aspirin or ibuprofen      

## 2017-09-09 LAB — PMP SCREEN PROFILE (10S), URINE
Amphetamine Scrn, Ur: NEGATIVE ng/mL
BARBITURATE SCREEN URINE: NEGATIVE ng/mL
BENZODIAZEPINE SCREEN, URINE: NEGATIVE ng/mL
CANNABINOIDS UR QL SCN: NEGATIVE ng/mL
COCAINE(METAB.)SCREEN, URINE: NEGATIVE ng/mL
Creatinine(Crt), U: 178.7 mg/dL (ref 20.0–300.0)
Methadone Screen, Urine: NEGATIVE ng/mL
OXYCODONE+OXYMORPHONE UR QL SCN: NEGATIVE ng/mL
Opiate Scrn, Ur: NEGATIVE ng/mL
Ph of Urine: 7.3 (ref 4.5–8.9)
Phencyclidine Qn, Ur: NEGATIVE ng/mL
Propoxyphene Scrn, Ur: NEGATIVE ng/mL

## 2017-09-09 LAB — CYTOLOGY - PAP
CHLAMYDIA, DNA PROBE: NEGATIVE
Diagnosis: NEGATIVE
NEISSERIA GONORRHEA: NEGATIVE

## 2017-09-10 LAB — OBSTETRIC PANEL, INCLUDING HIV
Antibody Screen: NEGATIVE
Basophils Absolute: 0 10*3/uL (ref 0.0–0.2)
Basos: 0 %
EOS (ABSOLUTE): 0 10*3/uL (ref 0.0–0.4)
Eos: 0 %
HIV Screen 4th Generation wRfx: NONREACTIVE
Hematocrit: 42.2 % (ref 34.0–46.6)
Hemoglobin: 14.4 g/dL (ref 11.1–15.9)
Hepatitis B Surface Ag: NEGATIVE
IMMATURE GRANS (ABS): 0 10*3/uL (ref 0.0–0.1)
IMMATURE GRANULOCYTES: 1 %
LYMPHS ABS: 1.5 10*3/uL (ref 0.7–3.1)
LYMPHS: 18 %
MCH: 29.6 pg (ref 26.6–33.0)
MCHC: 34.1 g/dL (ref 31.5–35.7)
MCV: 87 fL (ref 79–97)
MONOS ABS: 0.4 10*3/uL (ref 0.1–0.9)
Monocytes: 5 %
NEUTROS PCT: 76 %
Neutrophils Absolute: 6.3 10*3/uL (ref 1.4–7.0)
Platelets: 248 10*3/uL (ref 150–450)
RBC: 4.86 x10E6/uL (ref 3.77–5.28)
RDW: 12.9 % (ref 12.3–15.4)
RPR Ser Ql: NONREACTIVE
Rh Factor: POSITIVE
Rubella Antibodies, IGG: 0.99 index — ABNORMAL LOW (ref >0.99)
WBC: 8.3 10*3/uL (ref 3.4–10.8)

## 2017-09-10 LAB — URINE CULTURE: Organism ID, Bacteria: NO GROWTH

## 2017-10-06 ENCOUNTER — Ambulatory Visit (INDEPENDENT_AMBULATORY_CARE_PROVIDER_SITE_OTHER): Payer: Self-pay

## 2017-10-06 ENCOUNTER — Encounter: Payer: Medicaid Other | Admitting: Women's Health

## 2017-10-06 DIAGNOSIS — Z363 Encounter for antenatal screening for malformations: Secondary | ICD-10-CM

## 2017-10-06 DIAGNOSIS — Z3402 Encounter for supervision of normal first pregnancy, second trimester: Secondary | ICD-10-CM

## 2017-10-06 NOTE — Progress Notes (Signed)
US 17+6 wks,cx 3.4 cm,fundal placenta gr 0,svp of fluid 5.2 cm,fhr 130 bpm,normal ovaries bilat,efw 215 g 47%,limited view of heart and spine because of fetal age and position,please have pt come back for additional images,no obvious abnormalities

## 2017-10-07 ENCOUNTER — Other Ambulatory Visit: Payer: Medicaid Other

## 2017-10-07 ENCOUNTER — Encounter: Payer: Medicaid Other | Admitting: Women's Health

## 2017-10-17 ENCOUNTER — Ambulatory Visit (INDEPENDENT_AMBULATORY_CARE_PROVIDER_SITE_OTHER): Payer: Medicaid Other | Admitting: Women's Health

## 2017-10-17 ENCOUNTER — Encounter: Payer: Self-pay | Admitting: Women's Health

## 2017-10-17 VITALS — BP 122/71 | HR 89 | Wt 133.0 lb

## 2017-10-17 DIAGNOSIS — Z1389 Encounter for screening for other disorder: Secondary | ICD-10-CM

## 2017-10-17 DIAGNOSIS — Z3482 Encounter for supervision of other normal pregnancy, second trimester: Secondary | ICD-10-CM

## 2017-10-17 DIAGNOSIS — Z3A19 19 weeks gestation of pregnancy: Secondary | ICD-10-CM

## 2017-10-17 DIAGNOSIS — Z362 Encounter for other antenatal screening follow-up: Secondary | ICD-10-CM

## 2017-10-17 DIAGNOSIS — Z331 Pregnant state, incidental: Secondary | ICD-10-CM

## 2017-10-17 LAB — POCT URINALYSIS DIPSTICK
Blood, UA: NEGATIVE
Glucose, UA: NEGATIVE
Ketones, UA: NEGATIVE
Leukocytes, UA: NEGATIVE
Nitrite, UA: NEGATIVE
PROTEIN UA: NEGATIVE

## 2017-10-17 NOTE — Progress Notes (Signed)
   LOW-RISK PREGNANCY VISIT Patient name: Erin Watson MRN [id number]  Date of birth: Jul 11, 1995 Chief Complaint:   Routine Prenatal Visit  History of Present Illness:   Erin Watson is a 22 y.o. 702P0010 female at 404w3d with an Estimated Date of Delivery: 03/10/18 being seen today for ongoing management of a low-risk pregnancy.  Today she reports no complaints.  . Vag. Bleeding: None.  Movement: Absent. denies leaking of fluid. Review of Systems:   Pertinent items are noted in HPI Denies abnormal vaginal discharge w/ itching/odor/irritation, headaches, visual changes, shortness of breath, chest pain, abdominal pain, severe nausea/vomiting, or problems with urination or bowel movements unless otherwise stated above. Pertinent History Reviewed:  Reviewed past medical,surgical, social, obstetrical and family history.  Reviewed problem list, medications and allergies. Physical Assessment:   Vitals:   10/17/17 1510  BP: 122/71  Pulse: 89  Weight: 133 lb (60.3 kg)  Body mass index is 23.56 kg/m.        Physical Examination:   General appearance: Well appearing, and in no distress  Mental status: Alert, oriented to person, place, and time  Skin: Warm & dry  Cardiovascular: Normal heart rate noted  Respiratory: Normal respiratory effort, no distress  Abdomen: Soft, gravid, nontender  Pelvic: Cervical exam deferred         Extremities: Edema: None  Fetal Status: Fetal Heart Rate (bpm): 134   Movement: Absent    Results for orders placed or performed in visit on 10/17/17 (from the past 24 hour(s))  POCT urinalysis dipstick   Collection Time: 10/17/17  3:11 PM  Result Value Ref Range   Color, UA     Clarity, UA     Glucose, UA Negative Negative   Bilirubin, UA     Ketones, UA neg    Spec Grav, UA  1.010 - 1.025   Blood, UA neg    pH, UA  5.0 - 8.0   Protein, UA Negative Negative   Urobilinogen, UA  0.2 or 1.0 E.U./dL   Nitrite, UA neg    Leukocytes, UA Negative Negative    Appearance     Odor      Assessment & Plan:  1) Low-risk pregnancy G2P0010 at 294w3d with an Estimated Date of Delivery: 03/10/18   2) [employer] pt, continue weekly bp's, next visit @ 26wks   Meds: No orders of the defined types were placed in this encounter.  Labs/procedures today: none  Plan:  Continue routine obstetrical care   Reviewed: Preterm labor symptoms and general obstetric precautions including but not limited to vaginal bleeding, contractions, leaking of fluid and fetal movement were reviewed in detail with the patient.  All questions were answered  Follow-up: Return for next week for repeat anatomy u/s, then 7wks for LROB and PN2.  Orders Placed This Encounter  Procedures  . US OB Follow Up  . POCT urinalysis dipstick   Cheral MarkerKimberly R Quana Chamberlain CNM, [facility] LLCWHNP-BC 10/17/2017 3:28 PM

## 2017-10-17 NOTE — Patient Instructions (Signed)
Erin Watson, I greatly value your feedback.  If you receive a survey following your visit with us today, we appreciate you taking the time to fill it out.  Thanks, Erin HaffKim Tailor Watson, CNM, WHNP-BC   Second Trimester of Pregnancy The second trimester is from week 14 through week 27 (months 4 through 6). The second trimester is often a time when you feel your best. Your body has adjusted to being pregnant, and you begin to feel better physically. Usually, morning sickness has lessened or quit completely, you may have more energy, and you may have an increase in appetite. The second trimester is also a time when the fetus is growing rapidly. At the end of the sixth month, the fetus is about 9 inches long and weighs about 1 pounds. You will likely begin to feel the baby move (quickening) between 16 and 20 weeks of pregnancy. Body changes during your second trimester Your body continues to go through many changes during your second trimester. The changes vary from woman to woman.  Your weight will continue to increase. You will notice your lower abdomen bulging out.  You may begin to get stretch marks on your hips, abdomen, and breasts.  You may develop headaches that can be relieved by medicines. The medicines should be approved by your health care provider.  You may urinate more often because the fetus is pressing on your bladder.  You may develop or continue to have heartburn as a result of your pregnancy.  You may develop constipation because certain hormones are causing the muscles that push waste through your intestines to slow down.  You may develop hemorrhoids or swollen, bulging veins (varicose veins).  You may have back pain. This is caused by: ? Weight gain. ? Pregnancy hormones that are relaxing the joints in your pelvis. ? A shift in weight and the muscles that support your balance.  Your breasts will continue to grow and they will continue to become tender.  Your gums may bleed  and may be sensitive to brushing and flossing.  Dark spots or blotches (chloasma, mask of pregnancy) may develop on your face. This will likely fade after the baby is born.  A dark line from your belly button to the pubic area (linea nigra) may appear. This will likely fade after the baby is born.  You may have changes in your hair. These can include thickening of your hair, rapid growth, and changes in texture. Some women also have hair loss during or after pregnancy, or hair that feels dry or thin. Your hair will most likely return to normal after your baby is born.  What to expect at prenatal visits During a routine prenatal visit:  You will be weighed to make sure you and the fetus are growing normally.  Your blood pressure will be taken.  Your abdomen will be measured to track your baby's growth.  The fetal heartbeat will be listened to.  Any test results from the previous visit will be discussed.  Your health care provider may ask you:  How you are feeling.  If you are feeling the baby move.  If you have had any abnormal symptoms, such as leaking fluid, bleeding, severe headaches, or abdominal cramping.  If you are using any tobacco products, including cigarettes, chewing tobacco, and electronic cigarettes.  If you have any questions.  Other tests that may be performed during your second trimester include:  Blood tests that check for: ? Low iron levels (anemia). ? High  blood sugar that affects pregnant women (gestational diabetes) between 3 and 28 weeks. ? Rh antibodies. This is to check for a protein on red blood cells (Rh factor).  Urine tests to check for infections, diabetes, or protein in the urine.  An ultrasound to confirm the proper growth and development of the baby.  An amniocentesis to check for possible genetic problems.  Fetal screens for spina bifida and Down syndrome.  HIV (human immunodeficiency virus) testing. Routine prenatal testing includes  screening for HIV, unless you choose not to have this test.  Follow these instructions at home: Medicines  Follow your health care provider's instructions regarding medicine use. Specific medicines may be either safe or unsafe to take during pregnancy.  Take a prenatal vitamin that contains at least 600 micrograms (mcg) of folic acid.  If you develop constipation, try taking a stool softener if your health care provider approves. Eating and drinking  Eat a balanced diet that includes fresh fruits and vegetables, whole grains, good sources of protein such as meat, eggs, or tofu, and low-fat dairy. Your health care provider will help you determine the amount of weight gain that is right for you.  Avoid raw meat and uncooked cheese. These carry germs that can cause birth defects in the baby.  If you have low calcium intake from food, talk to your health care provider about whether you should take a daily calcium supplement.  Limit foods that are high in fat and processed sugars, such as fried and sweet foods.  To prevent constipation: ? Drink enough fluid to keep your urine clear or pale yellow. ? Eat foods that are high in fiber, such as fresh fruits and vegetables, whole grains, and beans. Activity  Exercise only as directed by your health care provider. Most women can continue their usual exercise routine during pregnancy. Try to exercise for 30 minutes at least 5 days a week. Stop exercising if you experience uterine contractions.  Avoid heavy lifting, wear low heel shoes, and practice good posture.  A sexual relationship may be continued unless your health care provider directs you otherwise. Relieving pain and discomfort  Wear a good support bra to prevent discomfort from breast tenderness.  Take warm sitz baths to soothe any pain or discomfort caused by hemorrhoids. Use hemorrhoid cream if your health care provider approves.  Rest with your legs elevated if you have leg cramps  or low back pain.  If you develop varicose veins, wear support hose. Elevate your feet for 15 minutes, 3-4 times a day. Limit salt in your diet. Prenatal Care  Write down your questions. Take them to your prenatal visits.  Keep all your prenatal visits as told by your health care provider. This is important. Safety  Wear your seat belt at all times when driving.  Make a list of emergency phone numbers, including numbers for family, friends, the hospital, and police and fire departments. General instructions  Ask your health care provider for a referral to a local prenatal education class. Begin classes no later than the beginning of month 6 of your pregnancy.  Ask for help if you have counseling or nutritional needs during pregnancy. Your health care provider can offer advice or refer you to specialists for help with various needs.  Do not use hot tubs, steam rooms, or saunas.  Do not douche or use tampons or scented sanitary pads.  Do not cross your legs for long periods of time.  Avoid cat litter boxes and soil  used by cats. These carry germs that can cause birth defects in the baby and possibly loss of the fetus by miscarriage or stillbirth.  Avoid all smoking, herbs, alcohol, and unprescribed drugs. Chemicals in these products can affect the formation and growth of the baby.  Do not use any products that contain nicotine or tobacco, such as cigarettes and e-cigarettes. If you need help quitting, ask your health care provider.  Visit your dentist if you have not gone yet during your pregnancy. Use a soft toothbrush to brush your teeth and be gentle when you floss. Contact a health care provider if:  You have dizziness.  You have mild pelvic cramps, pelvic pressure, or nagging pain in the abdominal area.  You have persistent nausea, vomiting, or diarrhea.  You have a bad smelling vaginal discharge.  You have pain when you urinate. Get help right away if:  You have a  fever.  You are leaking fluid from your vagina.  You have spotting or bleeding from your vagina.  You have severe abdominal cramping or pain.  You have rapid weight gain or weight loss.  You have shortness of breath with chest pain.  You notice sudden or extreme swelling of your face, hands, ankles, feet, or legs.  You have not felt your baby move in over an hour.  You have severe headaches that do not go away when you take medicine.  You have vision changes. Summary  The second trimester is from week 14 through week 27 (months 4 through 6). It is also a time when the fetus is growing rapidly.  Your body goes through many changes during pregnancy. The changes vary from woman to woman.  Avoid all smoking, herbs, alcohol, and unprescribed drugs. These chemicals affect the formation and growth your baby.  Do not use any tobacco products, such as cigarettes, chewing tobacco, and e-cigarettes. If you need help quitting, ask your health care provider.  Contact your health care provider if you have any questions. Keep all prenatal visits as told by your health care provider. This is important. This information is not intended to replace advice given to you by your health care provider. Make sure you discuss any questions you have with your health care provider. Document Released: 03/23/2001 Document Revised: 09/04/2015 Document Reviewed: 05/30/2012 Elsevier Interactive Patient Education  2017 [employer].

## 2017-10-24 ENCOUNTER — Encounter: Payer: Self-pay | Admitting: Women's Health

## 2017-10-24 ENCOUNTER — Telehealth: Payer: Self-pay | Admitting: *Deleted

## 2017-10-24 NOTE — Telephone Encounter (Signed)
Patient's mother called stating her daughter has 4 bumps that are itching and is concerned.  Her husband also has them but it is all over him.  Asked if she could send mychart message with a photo and stated she would.

## 2017-10-27 ENCOUNTER — Ambulatory Visit (INDEPENDENT_AMBULATORY_CARE_PROVIDER_SITE_OTHER): Payer: Self-pay

## 2017-10-27 DIAGNOSIS — Z362 Encounter for other antenatal screening follow-up: Secondary | ICD-10-CM

## 2017-10-27 DIAGNOSIS — Z3A20 20 weeks gestation of pregnancy: Secondary | ICD-10-CM

## 2017-10-27 DIAGNOSIS — Z3482 Encounter for supervision of other normal pregnancy, second trimester: Secondary | ICD-10-CM

## 2017-10-27 NOTE — Progress Notes (Signed)
US 20+6 wks,cephalic,fundal pl gr 0,cx 3.8 cm,svp of fluid 6 cm,normal ovaries bilat,fhr 142 bpm,efw 372 g 37%,anatomy of the spine and heart complete,no obvious abnormalities

## 2017-12-01 ENCOUNTER — Other Ambulatory Visit: Payer: Self-pay

## 2017-12-01 ENCOUNTER — Ambulatory Visit (INDEPENDENT_AMBULATORY_CARE_PROVIDER_SITE_OTHER): Payer: Self-pay | Admitting: Women's Health

## 2017-12-01 ENCOUNTER — Encounter: Payer: Self-pay | Admitting: Women's Health

## 2017-12-01 VITALS — BP 120/71 | HR 71 | Wt 137.6 lb

## 2017-12-01 DIAGNOSIS — Z3A25 25 weeks gestation of pregnancy: Secondary | ICD-10-CM

## 2017-12-01 DIAGNOSIS — Z3482 Encounter for supervision of other normal pregnancy, second trimester: Secondary | ICD-10-CM

## 2017-12-01 DIAGNOSIS — Z131 Encounter for screening for diabetes mellitus: Secondary | ICD-10-CM

## 2017-12-01 DIAGNOSIS — Z1389 Encounter for screening for other disorder: Secondary | ICD-10-CM

## 2017-12-01 DIAGNOSIS — Z331 Pregnant state, incidental: Secondary | ICD-10-CM

## 2017-12-01 LAB — POCT URINALYSIS DIPSTICK OB
Glucose, UA: NEGATIVE — AB
Ketones, UA: NEGATIVE
NITRITE UA: NEGATIVE
PROTEIN: NEGATIVE
RBC UA: NEGATIVE

## 2017-12-01 NOTE — Patient Instructions (Addendum)
Erin Watson, I greatly value your feedback.  If you receive a survey following your visit with us today, we appreciate you taking the time to fill it out.  Thanks, Joellyn HaffKim Dilan Fullenwider, CNM, WHNP-BC   Call the office [phone]) or go to [facility] if:  You begin to have strong, frequent contractions  Your water breaks.  Sometimes it is a big gush of fluid, sometimes it is just a trickle that keeps getting your panties wet or running down your legs  You have vaginal bleeding.  It is normal to have a small amount of spotting if your cervix was checked.   You don't feel your baby moving like normal.  If you don't, get you something to eat and drink and lay down and focus on feeling your baby move.  You should feel at least 10 movements in 2 hours.  If you don't, you should call the office or go to [facility].    Tdap Vaccine  It is recommended that you get the Tdap vaccine during the third trimester of EACH pregnancy to help protect your baby from getting pertussis (whooping cough)  27-36 weeks is the BEST time to do this so that you can pass the protection on to your baby. During pregnancy is better than after pregnancy, but if you are unable to get it during pregnancy it will be offered at the hospital.   You can get this vaccine with us, at the health department, your family doctor, or some local pharmacies  Everyone who will be around your baby should also be up-to-date on their vaccines before the baby comes. Adults (who are not pregnant) only need 1 dose of Tdap during adulthood.   For your lower back pain you may:  Purchase a pregnancy belt from [employer], [employer], [employer], etc and wear it while you are up and about  Take warm baths  Use a heating pad to your lower back for no longer than 20 minutes at a time, and do not place near abdomen  Take tylenol as needed. Please follow directions on the bottle  Kinesiology tape (can get from sporting goods store), google  how to tape belly for pregnancy    Third Trimester of Pregnancy The third trimester is from week 29 through week 42, months 7 through 9. The third trimester is a time when the fetus is growing rapidly. At the end of the ninth month, the fetus is about 20 inches in length and weighs 6-10 pounds.  BODY CHANGES Your body goes through many changes during pregnancy. The changes vary from woman to woman.   Your weight will continue to increase. You can expect to gain 25-35 pounds (11-16 kg) by the end of the pregnancy.  You may begin to get stretch marks on your hips, abdomen, and breasts.  You may urinate more often because the fetus is moving lower into your pelvis and pressing on your bladder.  You may develop or continue to have heartburn as a result of your pregnancy.  You may develop constipation because certain hormones are causing the muscles that push waste through your intestines to slow down.  You may develop hemorrhoids or swollen, bulging veins (varicose veins).  You may have pelvic pain because of the weight gain and pregnancy hormones relaxing your joints between the bones in your pelvis. Backaches may result from overexertion of the muscles supporting your posture.  You may have changes in your hair. These can include thickening of your hair,  rapid growth, and changes in texture. Some women also have hair loss during or after pregnancy, or hair that feels dry or thin. Your hair will most likely return to normal after your baby is born.  Your breasts will continue to grow and be tender. A yellow discharge may leak from your breasts called colostrum.  Your belly button may stick out.  You may feel short of breath because of your expanding uterus.  You may notice the fetus "dropping," or moving lower in your abdomen.  You may have a bloody mucus discharge. This usually occurs a few days to a week before labor begins.  Your cervix becomes thin and soft (effaced) near your due  date. WHAT TO EXPECT AT YOUR PRENATAL EXAMS  You will have prenatal exams every 2 weeks until week 36. Then, you will have weekly prenatal exams. During a routine prenatal visit:  You will be weighed to make sure you and the fetus are growing normally.  Your blood pressure is taken.  Your abdomen will be measured to track your baby's growth.  The fetal heartbeat will be listened to.  Any test results from the previous visit will be discussed.  You may have a cervical check near your due date to see if you have effaced. At around 36 weeks, your caregiver will check your cervix. At the same time, your caregiver will also perform a test on the secretions of the vaginal tissue. This test is to determine if a type of bacteria, Group B streptococcus, is present. Your caregiver will explain this further. Your caregiver may ask you:  What your birth plan is.  How you are feeling.  If you are feeling the baby move.  If you have had any abnormal symptoms, such as leaking fluid, bleeding, severe headaches, or abdominal cramping.  If you have any questions. Other tests or screenings that may be performed during your third trimester include:  Blood tests that check for low iron levels (anemia).  Fetal testing to check the health, activity level, and growth of the fetus. Testing is done if you have certain medical conditions or if there are problems during the pregnancy. FALSE LABOR You may feel small, irregular contractions that eventually go away. These are called Braxton Hicks contractions, or false labor. Contractions may last for hours, days, or even weeks before true labor sets in. If contractions come at regular intervals, intensify, or become painful, it is best to be seen by your caregiver.  SIGNS OF LABOR   Menstrual-like cramps.  Contractions that are 5 minutes apart or less.  Contractions that start on the top of the uterus and spread down to the lower abdomen and back.  A  sense of increased pelvic pressure or back pain.  A watery or bloody mucus discharge that comes from the vagina. If you have any of these signs before the 37th week of pregnancy, call your caregiver right away. You need to go to the hospital to get checked immediately. HOME CARE INSTRUCTIONS   Avoid all smoking, herbs, alcohol, and unprescribed drugs. These chemicals affect the formation and growth of the baby.  Follow your caregiver's instructions regarding medicine use. There are medicines that are either safe or unsafe to take during pregnancy.  Exercise only as directed by your caregiver. Experiencing uterine cramps is a good sign to stop exercising.  Continue to eat regular, healthy meals.  Wear a good support bra for breast tenderness.  Do not use hot tubs, steam rooms, or  saunas.  Wear your seat belt at all times when driving.  Avoid raw meat, uncooked cheese, cat litter boxes, and soil used by cats. These carry germs that can cause birth defects in the baby.  Take your prenatal vitamins.  Try taking a stool softener (if your caregiver approves) if you develop constipation. Eat more high-fiber foods, such as fresh vegetables or fruit and whole grains. Drink plenty of fluids to keep your urine clear or pale yellow.  Take warm sitz baths to soothe any pain or discomfort caused by hemorrhoids. Use hemorrhoid cream if your caregiver approves.  If you develop varicose veins, wear support hose. Elevate your feet for 15 minutes, 3-4 times a day. Limit salt in your diet.  Avoid heavy lifting, wear low heal shoes, and practice good posture.  Rest a lot with your legs elevated if you have leg cramps or low back pain.  Visit your dentist if you have not gone during your pregnancy. Use a soft toothbrush to brush your teeth and be gentle when you floss.  A sexual relationship may be continued unless your caregiver directs you otherwise.  Do not travel far distances unless it is  absolutely necessary and only with the approval of your caregiver.  Take prenatal classes to understand, practice, and ask questions about the labor and delivery.  Make a trial run to the hospital.  Pack your hospital bag.  Prepare the baby's nursery.  Continue to go to all your prenatal visits as directed by your caregiver. SEEK MEDICAL CARE IF:  You are unsure if you are in labor or if your water has broken.  You have dizziness.  You have mild pelvic cramps, pelvic pressure, or nagging pain in your abdominal area.  You have persistent nausea, vomiting, or diarrhea.  You have a bad smelling vaginal discharge.  You have pain with urination. SEEK IMMEDIATE MEDICAL CARE IF:   You have a fever.  You are leaking fluid from your vagina.  You have spotting or bleeding from your vagina.  You have severe abdominal cramping or pain.  You have rapid weight loss or gain.  You have shortness of breath with chest pain.  You notice sudden or extreme swelling of your face, hands, ankles, feet, or legs.  You have not felt your baby move in over an hour.  You have severe headaches that do not go away with medicine.  You have vision changes. Document Released: 03/23/2001 Document Revised: 04/03/2013 Document Reviewed: 05/30/2012 [facility] Patient Information 2015 [place], [place]. This information is not intended to replace advice given to you by your health care provider. Make sure you discuss any questions you have with your health care provider.

## 2017-12-01 NOTE — Progress Notes (Signed)
   LOW-RISK PREGNANCY VISIT Patient name: Erin Watson MRN [id number]  Date of birth: 06-14-1995 Chief Complaint:   Routine Prenatal Visit (PN2)  History of Present Illness:   Erin Watson is a 22 y.o. 482P0010 female at 5133w6d with an Estimated Date of Delivery: 03/10/18 being seen today for ongoing management of a low-risk pregnancy.  Today she reports some lower back pain after working.  .  .  Movement: Present. denies leaking of fluid. Review of Systems:   Pertinent items are noted in HPI Denies abnormal vaginal discharge w/ itching/odor/irritation, headaches, visual changes, shortness of breath, chest pain, abdominal pain, severe nausea/vomiting, or problems with urination or bowel movements unless otherwise stated above. Pertinent History Reviewed:  Reviewed past medical,surgical, social, obstetrical and family history.  Reviewed problem list, medications and allergies. Physical Assessment:   Vitals:   12/01/17 0920  BP: 120/71  Pulse: 71  Weight: 137 lb 9.6 oz (62.4 kg)  Body mass index is 24.37 kg/m.        Physical Examination:   General appearance: Well appearing, and in no distress  Mental status: Alert, oriented to person, place, and time  Skin: Warm & dry  Cardiovascular: Normal heart rate noted  Respiratory: Normal respiratory effort, no distress  Abdomen: Soft, gravid, nontender  Pelvic: Cervical exam deferred         Extremities: Edema: None  Fetal Status: Fetal Heart Rate (bpm): 152 Fundal Height: 25 cm Movement: Present    Results for orders placed or performed in visit on 12/01/17 (from the past 24 hour(s))  POC Urinalysis Dipstick OB   Collection Time: 12/01/17  9:27 AM  Result Value Ref Range   Color, UA     Clarity, UA     Glucose, UA Negative (A) (none)   Bilirubin, UA     Ketones, UA neg    Spec Grav, UA     Blood, UA neg    pH, UA     POC Protein UA Negative Negative, Trace   Urobilinogen, UA     Nitrite, UA neg    Leukocytes, UA  Moderate (2+) (A) Negative   Appearance     Odor      Assessment & Plan:  1) Low-risk pregnancy G2P0010 at 7033w6d with an Estimated Date of Delivery: 03/10/18   2) Low back pain, gave printed prevention/relief measures   3) BabyScripts Optimized Pt> is checking BP's weekly as directed. Next appt at 30wks    Meds: No orders of the defined types were placed in this encounter.  Labs/procedures today: pn2  Plan:  Continue routine obstetrical care   Reviewed: Preterm labor symptoms and general obstetric precautions including but not limited to vaginal bleeding, contractions, leaking of fluid and fetal movement were reviewed in detail with the patient.  All questions were answered  Follow-up: Return in about 4 weeks (around 12/29/2017) for LROB.  Orders Placed This Encounter  Procedures  . POC Urinalysis Dipstick OB   Cheral MarkerKimberly R Anndrea Mihelich CNM, [facility] 12/01/2017 9:47 AM

## 2017-12-02 LAB — CBC
HEMATOCRIT: 38.3 % (ref 34.0–46.6)
Hemoglobin: 13.2 g/dL (ref 11.1–15.9)
MCH: 30.3 pg (ref 26.6–33.0)
MCHC: 34.5 g/dL (ref 31.5–35.7)
MCV: 88 fL (ref 79–97)
PLATELETS: 250 10*3/uL (ref 150–450)
RBC: 4.36 x10E6/uL (ref 3.77–5.28)
RDW: 12.7 % (ref 12.3–15.4)
WBC: 9.9 10*3/uL (ref 3.4–10.8)

## 2017-12-02 LAB — GLUCOSE TOLERANCE, 2 HOURS W/ 1HR
GLUCOSE, 1 HOUR: 83 mg/dL (ref 65–179)
GLUCOSE, 2 HOUR: 91 mg/dL (ref 65–152)
Glucose, Fasting: 82 mg/dL (ref 65–91)

## 2017-12-02 LAB — ANTIBODY SCREEN: Antibody Screen: NEGATIVE

## 2017-12-02 LAB — RPR: RPR Ser Ql: NONREACTIVE

## 2017-12-02 LAB — HIV ANTIBODY (ROUTINE TESTING W REFLEX): HIV SCREEN 4TH GENERATION: NONREACTIVE

## 2017-12-08 ENCOUNTER — Emergency Department (HOSPITAL_COMMUNITY)
Admission: EM | Admit: 2017-12-08 | Discharge: 2017-12-09 | Disposition: A | Discharge status: Other Acute Inpatient Hospital | Payer: Medicaid Other | Attending: Emergency Medicine | Admitting: Emergency Medicine

## 2017-12-08 ENCOUNTER — Encounter (HOSPITAL_COMMUNITY): Payer: Self-pay | Admitting: *Deleted

## 2017-12-08 DIAGNOSIS — Z79899 Other long term (current) drug therapy: Secondary | ICD-10-CM | POA: Insufficient documentation

## 2017-12-08 DIAGNOSIS — O9989 Other specified diseases and conditions complicating pregnancy, childbirth and the puerperium: Secondary | ICD-10-CM | POA: Insufficient documentation

## 2017-12-08 DIAGNOSIS — Z3A26 26 weeks gestation of pregnancy: Secondary | ICD-10-CM | POA: Insufficient documentation

## 2017-12-08 DIAGNOSIS — R109 Unspecified abdominal pain: Secondary | ICD-10-CM

## 2017-12-08 DIAGNOSIS — O26892 Other specified pregnancy related conditions, second trimester: Secondary | ICD-10-CM

## 2017-12-08 DIAGNOSIS — Z87891 Personal history of nicotine dependence: Secondary | ICD-10-CM | POA: Insufficient documentation

## 2017-12-08 NOTE — Progress Notes (Signed)
[facility] notified of patient who presents to [facility] ED with complaints of leaking of fluid and back and abdominal pain and pressure; patient is a G2P0 who is [redacted] weeks along in her pregnancy at this time; ED staff applied EFM; Dr Macon LargeAnyanwu notified of patient arrival and complaints; orders given to have ED provider preform a speculum exam and will reassess plan of care at that time; assessing EFM

## 2017-12-08 NOTE — ED Notes (Signed)
Called [facility](Kristy, RN) to inform of pt on fetal monitor.

## 2017-12-08 NOTE — ED Triage Notes (Signed)
Pt with lower back that started yesterday and pressure with urinating.  Pt had a gush of fluid coming from groin area, pt unsure if she had urinated on herself or not.  Denies any blood.

## 2017-12-08 NOTE — ED Provider Notes (Signed)
[facility] EMERGENCY DEPARTMENT Provider Note   CSN: [id number] Arrival date & time: 12/08/17  2124     History   Chief Complaint Chief Complaint  Patient presents with  . Routine Prenatal Visit    HPI Erin Watson is a 22 y.o. female.  HPI   22 year old pregnant female with lower back and lower abdominal pain.  She is 26 weeks and 6 days.  Onset yesterday.  Persistent since then.  Earlier this evening she was walking her dog with a gush of fluid like she may repeat herself.  No bleeding.  Second pregnancy.  Miscarriage with her first.  She reports no significant issues with this pregnancy.   Past Medical History:  Diagnosis Date  . Contraceptive management 12/28/2013  . Irregular bleeding 12/28/2013  . Medical history non-contributory   . Miscarriage     Patient Active Problem List   Diagnosis Date Noted  . Supervision of normal pregnancy 09/08/2017  . Pyelonephritis affecting pregnancy in first trimester 08/22/2017  . Irregular bleeding 12/28/2013  . Spontaneous miscarriage 07/05/2013  . Rubella non-immune status, antepartum 06/18/2013    Past Surgical History:  Procedure Laterality Date  . WISDOM TOOTH EXTRACTION       OB History    Gravida  2   Para  0   Term      Preterm      AB  1   Living  0     SAB  1   TAB      Ectopic      Multiple      Live Births               Home Medications    Prior to Admission medications   Medication Sig Start Date End Date Taking? Authorizing Provider  Prenatal Vit-Fe Fumarate-FA (MULTIVITAMIN-PRENATAL) 27-0.8 MG TABS tablet Take 1 tablet by mouth daily at 12 noon.   Yes [provider]    Family History Family History  Problem Relation Age of Onset  . Hypertension Father   . Cancer Maternal Aunt        skin  . Diabetes Maternal Grandmother   . Diabetes Paternal Grandmother   . Heart disease Paternal Grandmother   . Cancer Paternal Grandfather     Social History Social  History   Tobacco Use  . Smoking status: Former Smoker    Types: Cigarettes  . Smokeless tobacco: Never Used  Substance Use Topics  . Alcohol use: No  . Drug use: No     Allergies   Patient has no known allergies.   Review of Systems Review of Systems  All systems reviewed and negative, other than as noted in HPI.  Physical Exam Updated Vital Signs BP (!) 133/57 (BP Location: Left Arm)   Pulse 71   Temp 97.6 F (36.4 C) (Oral)   Resp 18   Ht 5\' 3"  (1.6 m)   Wt 62.1 kg   LMP 06/03/2017   SpO2 99%   BMI 24.27 kg/m   Physical Exam  Constitutional: She appears well-developed and well-nourished. No distress.  HENT:  Head: Normocephalic and atraumatic.  Eyes: Conjunctivae are normal. Right eye exhibits no discharge. Left eye exhibits no discharge.  Neck: Neck supple.  Cardiovascular: Normal rate, regular rhythm and normal heart sounds. Exam reveals no gallop and no friction rub.  No murmur heard. Pulmonary/Chest: Effort normal and breath sounds normal. No respiratory distress.  Abdominal: Soft. She exhibits no distension. There is no tenderness.  Gravid uterus consistent with reported dates.  Moderate diffuse uterine tenderness.  Genitourinary:  Genitourinary Comments: PROM present.  Normal external genitalia without lesion.  Mild to moderate amount of whitish vaginal discharge, likely physiologic.  No pooling of fluids.  pH ~5.   Musculoskeletal: She exhibits no edema or tenderness.  Neurological: She is alert.  Skin: Skin is warm and dry.  Psychiatric: She has a normal mood and affect. Her behavior is normal. Thought content normal.  Nursing note and vitals reviewed.    ED Treatments / Results  Labs (all labs ordered are listed, but only abnormal results are displayed) Labs Reviewed - No data to display  EKG None  Radiology No results found.  Procedures Procedures (including critical care time)  Medications Ordered in ED Medications - No data to  display   Initial Impression / Assessment and Plan / ED Course  I have reviewed the triage vital signs and the nursing notes.  Pertinent labs & imaging results that were available during my care of the patient were reviewed by me and considered in my medical decision making (see chart for details).  I have reviewed the triage vital signs and the nursing notes. Prior records were reviewed for additional information.    Pertinent labs & imaging results that were available during my care of the patient were reviewed by me and considered in my medical decision making (see chart for details).  22 year old female with lower back and lower abdominal pain and pregnancy.  Uterine irritability noted on monitoring.  Sterile speculum exam with no pooling of fluids although vaginal pH was mildly elevated at around 5 on visual scale.   [facility]  Requesting transfer for further evaluation/monitoring.  Patient got option to go by private vehicle versus [facility].  Her mother is here and they feel comfortable taking her by private vehicle.   Final Clinical Impressions(s) / ED Diagnoses   Final diagnoses:  Abdominal pain during pregnancy in second trimester    ED Discharge Orders    None       Raeford RazorKohut, Brigitte Soderberg, MD 12/16/17 [phone]

## 2017-12-09 ENCOUNTER — Encounter (HOSPITAL_COMMUNITY): Payer: Self-pay | Admitting: *Deleted

## 2017-12-09 ENCOUNTER — Inpatient Hospital Stay (HOSPITAL_COMMUNITY)
Admission: AD | Admit: 2017-12-09 | Discharge: 2017-12-09 | Disposition: A | Discharge status: Home / Self Care | Payer: Self-pay | Source: Ambulatory Visit | Attending: Obstetrics & Gynecology | Admitting: Obstetrics & Gynecology

## 2017-12-09 DIAGNOSIS — E876 Hypokalemia: Secondary | ICD-10-CM

## 2017-12-09 DIAGNOSIS — Z808 Family history of malignant neoplasm of other organs or systems: Secondary | ICD-10-CM | POA: Insufficient documentation

## 2017-12-09 DIAGNOSIS — Z833 Family history of diabetes mellitus: Secondary | ICD-10-CM | POA: Insufficient documentation

## 2017-12-09 DIAGNOSIS — M545 Low back pain: Secondary | ICD-10-CM | POA: Insufficient documentation

## 2017-12-09 DIAGNOSIS — O4703 False labor before 37 completed weeks of gestation, third trimester: Secondary | ICD-10-CM

## 2017-12-09 DIAGNOSIS — Z87891 Personal history of nicotine dependence: Secondary | ICD-10-CM | POA: Insufficient documentation

## 2017-12-09 DIAGNOSIS — O479 False labor, unspecified: Secondary | ICD-10-CM

## 2017-12-09 DIAGNOSIS — Z9889 Other specified postprocedural states: Secondary | ICD-10-CM | POA: Insufficient documentation

## 2017-12-09 DIAGNOSIS — Z8249 Family history of ischemic heart disease and other diseases of the circulatory system: Secondary | ICD-10-CM | POA: Insufficient documentation

## 2017-12-09 DIAGNOSIS — O163 Unspecified maternal hypertension, third trimester: Secondary | ICD-10-CM

## 2017-12-09 DIAGNOSIS — Z3A27 27 weeks gestation of pregnancy: Secondary | ICD-10-CM | POA: Insufficient documentation

## 2017-12-09 DIAGNOSIS — O26893 Other specified pregnancy related conditions, third trimester: Secondary | ICD-10-CM | POA: Insufficient documentation

## 2017-12-09 DIAGNOSIS — Z3482 Encounter for supervision of other normal pregnancy, second trimester: Secondary | ICD-10-CM

## 2017-12-09 DIAGNOSIS — R030 Elevated blood-pressure reading, without diagnosis of hypertension: Secondary | ICD-10-CM | POA: Insufficient documentation

## 2017-12-09 LAB — COMPREHENSIVE METABOLIC PANEL
ALK PHOS: 74 U/L (ref 38–126)
ALT: 30 U/L (ref 0–44)
AST: 20 U/L (ref 15–41)
Albumin: 4.2 g/dL (ref 3.5–5.0)
Anion gap: 13 (ref 5–15)
BUN: 6 mg/dL (ref 6–20)
CALCIUM: 9.2 mg/dL (ref 8.9–10.3)
CHLORIDE: 101 mmol/L (ref 98–111)
CO2: 18 mmol/L — AB (ref 22–32)
CREATININE: 0.51 mg/dL (ref 0.44–1.00)
GFR calc Af Amer: >60 mL/min (ref >60)
Glucose, Bld: 104 mg/dL — ABNORMAL HIGH (ref 70–99)
Potassium: 2.9 mmol/L — ABNORMAL LOW (ref 3.5–5.1)
SODIUM: 132 mmol/L — AB (ref 135–145)
Total Bilirubin: 1.3 mg/dL — ABNORMAL HIGH (ref 0.3–1.2)
Total Protein: 7.8 g/dL (ref 6.5–8.1)

## 2017-12-09 LAB — CBC
HEMATOCRIT: 38.8 % (ref 36.0–46.0)
HEMOGLOBIN: 13.6 g/dL (ref 12.0–15.0)
MCH: 30.6 pg (ref 26.0–34.0)
MCHC: 35.1 g/dL (ref 30.0–36.0)
MCV: 87.2 fL (ref 78.0–100.0)
PLATELETS: 241 10*3/uL (ref 150–400)
RBC: 4.45 MIL/uL (ref 3.87–5.11)
RDW: 12.7 % (ref 11.5–15.5)
WBC: 14.2 10*3/uL — ABNORMAL HIGH (ref 4.0–10.5)

## 2017-12-09 LAB — URINALYSIS, ROUTINE W REFLEX MICROSCOPIC
Bilirubin Urine: NEGATIVE
GLUCOSE, UA: NEGATIVE mg/dL
Hgb urine dipstick: NEGATIVE
Ketones, ur: NEGATIVE mg/dL
NITRITE: NEGATIVE
PH: 6 (ref 5.0–8.0)
Protein, ur: NEGATIVE mg/dL
SPECIFIC GRAVITY, URINE: 1.008 (ref 1.005–1.030)

## 2017-12-09 LAB — PROTEIN / CREATININE RATIO, URINE
Creatinine, Urine: 66 mg/dL
Total Protein, Urine: <6 mg/dL

## 2017-12-09 MED ORDER — POTASSIUM CHLORIDE 2 MEQ/ML IV SOLN
Freq: Once | INTRAVENOUS | Status: DC
  Filled 2017-12-09: qty 1000

## 2017-12-09 MED ORDER — INDOMETHACIN 50 MG PO CAPS
100.0000 mg | ORAL_CAPSULE | Freq: Once | ORAL | Status: AC
  Administered 2017-12-09: 100 mg via ORAL
  Filled 2017-12-09: qty 2

## 2017-12-09 MED ORDER — POTASSIUM CHLORIDE 20 MEQ PO PACK
40.0000 meq | PACK | Freq: Once | ORAL | Status: DC
  Filled 2017-12-09: qty 2

## 2017-12-09 MED ORDER — POTASSIUM CHLORIDE 20 MEQ PO PACK: 40.0000 meq | PACK | Freq: Every day | ORAL | 0 refills | Status: DC

## 2017-12-09 MED ORDER — POTASSIUM CHLORIDE 20 MEQ/15ML (10%) PO SOLN
40.0000 meq | Freq: Every day | ORAL | Status: DC
  Administered 2017-12-09: 40 meq via ORAL
  Filled 2017-12-09: qty 30

## 2017-12-09 MED ORDER — NIFEDIPINE 10 MG PO CAPS
10.0000 mg | ORAL_CAPSULE | ORAL | Status: DC | PRN
  Administered 2017-12-09 (×3): 10 mg via ORAL
  Filled 2017-12-09 (×4): qty 1

## 2017-12-09 MED ORDER — POTASSIUM CHLORIDE CRYS ER 20 MEQ PO TBCR: 20.0000 meq | EXTENDED_RELEASE_TABLET | Freq: Once | ORAL | Status: DC

## 2017-12-09 MED ORDER — LACTATED RINGERS IV SOLN
INTRAVENOUS | Status: DC
  Administered 2017-12-09: 07:00:00 via INTRAVENOUS

## 2017-12-09 MED ORDER — POTASSIUM CHLORIDE CRYS ER 20 MEQ PO TBCR
40.0000 meq | EXTENDED_RELEASE_TABLET | Freq: Once | ORAL | Status: DC
  Filled 2017-12-09: qty 2

## 2017-12-09 MED ORDER — OXYCODONE-ACETAMINOPHEN 5-325 MG PO TABS
1.0000 | ORAL_TABLET | Freq: Once | ORAL | Status: AC
  Administered 2017-12-09: 1 via ORAL
  Filled 2017-12-09: qty 1

## 2017-12-09 NOTE — MAU Note (Signed)
PT WAS NOW REGISTERED TO BE SEEN

## 2017-12-09 NOTE — Progress Notes (Signed)
Per Dr Juleen ChinaKohut no pooling of fluid noted during speculum exam; patient uncomfortable with UI noted on EFM; patient to transfer care to MAU at this time; patient to arrive via private vehicle; MAU charge called with report on patient at this time

## 2017-12-09 NOTE — MAU Provider Note (Addendum)
Chief Complaint:  Uterine contractions and back pain   First Provider Initiated Contact with Patient 12/09/17 0351     HPI: Erin Watson is a 22 y.o. G2P0010 at 7027w0dwho presents to maternity admissions reporting pelvic pain accompanied by back pain with contractions . She reports good fetal movement, denies LOF, vaginal bleeding, vaginal itching/burning, urinary symptoms, h/a, dizziness, n/v, diarrhea, constipation or fever/chills.  She denies headache, visual changes or RUQ abdominal pain.  Was seen in other ED and evaluation for ruptured membranes was negative. She was having uterine irritability and was sent here for treatment.    Back Pain  This is a new problem. The current episode started today. The problem occurs intermittently. The problem is unchanged. The pain is present in the lumbar spine. The quality of the pain is described as cramping. The pain does not radiate. Stiffness is present all day. Associated symptoms include abdominal pain and pelvic pain. Pertinent negatives include no dysuria, fever, leg pain or weakness. She has tried nothing for the symptoms.  Abdominal Pain  This is a new problem. The current episode started today. The problem occurs intermittently. The problem has been unchanged. The pain is located in the suprapubic region, LLQ and RLQ. The quality of the pain is cramping. The abdominal pain radiates to the back. Pertinent negatives include no constipation, diarrhea, dysuria or fever. Nothing aggravates the pain. The pain is relieved by nothing. She has tried nothing for the symptoms.    RN Note PT SAYS SHE WENT  TO [place]- THOUGHT SROM-- AP SAYS NOT  SROM- - VAG D/C.  ON MONITOR-  IRRIT.- SENT  TO HERE TO BE MONITORED.    SAYS NOW HER BACK  IS HURTING-- LOWER ABD  GETS TIGHT.    LAST SEX-  Tuesday.  Past Medical History: Past Medical History:  Diagnosis Date  . Contraceptive management 12/28/2013  . Irregular bleeding 12/28/2013  . Medical history  non-contributory   . Miscarriage     Past obstetric history: OB History  Gravida Para Term Preterm AB Living  2 0     1 0  SAB TAB Ectopic Multiple Live Births  1            # Outcome Date GA Lbr Len/2nd Weight Sex Delivery Anes PTL Lv  2 Current           1 SAB 07/05/13 7863w2d     None  FD    Past Surgical History: Past Surgical History:  Procedure Laterality Date  . WISDOM TOOTH EXTRACTION      Family History: Family History  Problem Relation Age of Onset  . Hypertension Father   . Cancer Maternal Aunt        skin  . Diabetes Maternal Grandmother   . Diabetes Paternal Grandmother   . Heart disease Paternal Grandmother   . Cancer Paternal Grandfather     Social History: Social History   Tobacco Use  . Smoking status: Former Smoker    Types: Cigarettes  . Smokeless tobacco: Never Used  Substance Use Topics  . Alcohol use: No  . Drug use: No    Allergies: No Known Allergies  Meds:  Medications Prior to Admission  Medication Sig Dispense Refill Last Dose  . Prenatal Vit-Fe Fumarate-FA (MULTIVITAMIN-PRENATAL) 27-0.8 MG TABS tablet Take 1 tablet by mouth daily at 12 noon.   12/07/2017 at Unknown time    I have reviewed patient's Past Medical Hx, Surgical Hx, Family Hx, Social Hx, medications  and allergies.   ROS:  Review of Systems  Constitutional: Negative for fever.  Gastrointestinal: Positive for abdominal pain. Negative for constipation and diarrhea.  Genitourinary: Positive for pelvic pain. Negative for dysuria.  Musculoskeletal: Positive for back pain.  Neurological: Negative for weakness.   Other systems negative  Physical Exam   Vitals:   12/09/17 0522 12/09/17 0558 12/09/17 0600 12/09/17 0624  BP: 124/60 140/72 140/72 129/65  Pulse:   (!) 110   Resp:      Temp:      TempSrc:      Weight:      Height:       Latest BP 147/92  Constitutional: Well-developed, well-nourished female in no acute distress.  Cardiovascular: normal rate and  rhythm Respiratory: normal effort, clear to auscultation bilaterally GI: Abd soft, non-tender, gravid appropriate for gestational age.   No rebound or guarding. MS: Extremities nontender, no edema, normal ROM Neurologic: Alert and oriented x 4.  GU: Neg CVAT.  PELVIC EXAM: Done in ED. Negative eval for ruptured membranes. Fetal fibronectin not collected.                           .Dilation: Fingertip Effacement (%): 30 Cervical Position: Posterior Station: -3 Presentation: Vertex Exam by:: Mwilliams, CNM  FHT:  Baseline 140 , moderate variability, accelerations present, no decelerations Contractions: q 2 mins Irregular     Labs: O/Positive/-- (05/30 1131) Results for orders placed or performed during the hospital encounter of 12/09/17 (from the past 24 hour(s))  Urinalysis, Routine w reflex microscopic     Status: Abnormal   Collection Time: 12/09/17  4:06 AM  Result Value Ref Range   Color, Urine YELLOW YELLOW   APPearance HAZY (A) CLEAR   Specific Gravity, Urine 1.008 1.005 - 1.030   pH 6.0 5.0 - 8.0   Glucose, UA NEGATIVE NEGATIVE mg/dL   Hgb urine dipstick NEGATIVE NEGATIVE   Bilirubin Urine NEGATIVE NEGATIVE   Ketones, ur NEGATIVE NEGATIVE mg/dL   Protein, ur NEGATIVE NEGATIVE mg/dL   Nitrite NEGATIVE NEGATIVE   Leukocytes, UA TRACE (A) NEGATIVE   RBC / HPF 0-5 0 - 5 RBC/hpf   WBC, UA 0-5 0 - 5 WBC/hpf   Bacteria, UA RARE (A) NONE SEEN   Squamous Epithelial / LPF 11-20 0 - 5   Trans Epithel, UA <1    Mucus PRESENT   Protein / creatinine ratio, urine     Status: None   Collection Time: 12/09/17  4:06 AM  Result Value Ref Range   Creatinine, Urine 66.00 mg/dL   Total Protein, Urine <6.00 mg/dL   Protein Creatinine Ratio        0.00 - 0.15 mg/mg[Cre]  CBC     Status: Abnormal   Collection Time: 12/09/17  7:02 AM  Result Value Ref Range   WBC 14.2 (H) 4.0 - 10.5 K/uL   RBC 4.45 3.87 - 5.11 MIL/uL   Hemoglobin 13.6 12.0 - 15.0 g/dL   HCT 16.1 09.6 - 04.5 %   MCV  87.2 78.0 - 100.0 fL   MCH 30.6 26.0 - 34.0 pg   MCHC 35.1 30.0 - 36.0 g/dL   RDW 40.9 81.1 - 91.4 %   Platelets 241 150 - 400 K/uL  Comprehensive metabolic panel     Status: Abnormal   Collection Time: 12/09/17  7:02 AM  Result Value Ref Range   Sodium 132 (L) 135 - 145 mmol/L   Potassium  2.9 (L) 3.5 - 5.1 mmol/L   Chloride 101 98 - 111 mmol/L   CO2 18 (L) 22 - 32 mmol/L   Glucose, Bld 104 (H) 70 - 99 mg/dL   BUN 6 6 - 20 mg/dL   Creatinine, Ser 1.61 0.44 - 1.00 mg/dL   Calcium 9.2 8.9 - 09.6 mg/dL   Total Protein 7.8 6.5 - 8.1 g/dL   Albumin 4.2 3.5 - 5.0 g/dL   AST 20 15 - 41 U/L   ALT 30 0 - 44 U/L   Alkaline Phosphatase 74 38 - 126 U/L   Total Bilirubin 1.3 (H) 0.3 - 1.2 mg/dL   GFR calc non Af Amer >60 >60 mL/min   GFR calc Af Amer >60 >60 mL/min   Anion gap 13 5 - 15    Imaging:  No results found.  MAU Course/MDM: I have ordered labs and reviewed results. I added preeclampsia labs when she had several elevated BPs.  NST reviewed and is reactive.  Uterine contractions interspersed with irritability.    We gave three doses of Procardia but then patient developed a headache and we stopped Procardia  It did help contractions get less painful.    Consult Dr Macon Large with presentation, exam findings and test results. She recommends giving a dose of Indomethacin 100mg  with some IV fluids.  This should help headache and irritability.      Assessment: Single intrauterine pregnancy at [redacted]w[redacted]d Preterm uterine contractions with back pain Elevated blood pressure, r/o gestational hypertension  Plan: Continue IV fluids Observe for response to indomethacin. Can offer pt indomethacin 50mg  for home use PRN x 2 days. Headache not improved with indocin, so will give a Percocet Report given to oncoming provider   Wynelle Bourgeois CNM, MSN Certified Nurse-Midwife 12/09/2017 3:51 AM

## 2017-12-09 NOTE — MAU Note (Signed)
PT SAYS SHE WENT  TO [place]- THOUGHT SROM-- AP SAYS NOT  SROM- - VAG D/C.  ON MONITOR-  IRRIT.- SENT  TO HERE TO BE MONITORED.    SAYS NOW HER BACK  IS HURTING-- LOWER ABD  GETS TIGHT.    LAST SEX-  Tuesday.

## 2017-12-13 ENCOUNTER — Ambulatory Visit (INDEPENDENT_AMBULATORY_CARE_PROVIDER_SITE_OTHER): Payer: Medicaid Other

## 2017-12-13 VITALS — BP 121/78 | HR 81 | Ht 63.0 in | Wt 138.4 lb

## 2017-12-13 DIAGNOSIS — Z331 Pregnant state, incidental: Secondary | ICD-10-CM

## 2017-12-13 DIAGNOSIS — Z0130 Encounter for examination of blood pressure without abnormal findings: Secondary | ICD-10-CM

## 2017-12-13 DIAGNOSIS — Z1389 Encounter for screening for other disorder: Secondary | ICD-10-CM

## 2017-12-13 LAB — POCT URINALYSIS DIPSTICK OB
GLUCOSE, UA: NEGATIVE
KETONES UA: NEGATIVE
Leukocytes, UA: NEGATIVE
Nitrite, UA: NEGATIVE
POC,PROTEIN,UA: NEGATIVE
RBC UA: NEGATIVE

## 2017-12-13 NOTE — Progress Notes (Signed)
Patient states she has a headache unrelieved by Tylenol.  States she is only taking one Tylenol. Informed patient that [facility] labs were normal and reviewed with Dr Despina Hidden.  ADvised to take 2 tylenol and see if that helped along with pushing fluids.  Verbalized understanding.

## 2017-12-29 ENCOUNTER — Ambulatory Visit (INDEPENDENT_AMBULATORY_CARE_PROVIDER_SITE_OTHER): Payer: Medicaid Other | Admitting: Women's Health

## 2017-12-29 ENCOUNTER — Encounter: Payer: Self-pay | Admitting: Women's Health

## 2017-12-29 VITALS — BP 116/63 | HR 76 | Wt 143.0 lb

## 2017-12-29 DIAGNOSIS — Z3483 Encounter for supervision of other normal pregnancy, third trimester: Secondary | ICD-10-CM

## 2017-12-29 DIAGNOSIS — Z331 Pregnant state, incidental: Secondary | ICD-10-CM | POA: Diagnosis not present

## 2017-12-29 DIAGNOSIS — Z1389 Encounter for screening for other disorder: Secondary | ICD-10-CM

## 2017-12-29 DIAGNOSIS — Z3A29 29 weeks gestation of pregnancy: Secondary | ICD-10-CM

## 2017-12-29 LAB — POCT URINALYSIS DIPSTICK OB
Glucose, UA: NEGATIVE
KETONES UA: NEGATIVE
Leukocytes, UA: NEGATIVE
Nitrite, UA: NEGATIVE
POC,PROTEIN,UA: NEGATIVE
RBC UA: NEGATIVE

## 2017-12-29 NOTE — Progress Notes (Signed)
LOW-RISK PREGNANCY VISIT Patient name: Erin Watson Claw MRN [id number]  Date of birth: [birth year] Chief Complaint:   Routine Prenatal Visit  History of Present Illness:   Erin Watson Rena is a 22 y.o. 262P0010 female at 551w6d with an Estimated Date of Delivery: 03/10/18 being seen today for ongoing management of a low-risk pregnancy.  Today she reports 2 recent nosebleeds that lasted app 20mins- were both spontaneous (no recent sneezing/blowing nose, etc) and were 'gushing'- one while she was sitting down at home, and one while sitting down at [employer]. .  .  .  Movement: Present. denies leaking of fluid. Review of Systems:   Pertinent items are noted in HPI Denies abnormal vaginal discharge w/ itching/odor/irritation, headaches, visual changes, shortness of breath, chest pain, abdominal pain, severe nausea/vomiting, or problems with urination or bowel movements unless otherwise stated above. Pertinent History Reviewed:  Reviewed past medical,surgical, social, obstetrical and family history.  Reviewed problem list, medications and allergies. Physical Assessment:   Vitals:   12/29/17 1611  BP: 116/63  Pulse: 76  Weight: 143 lb (64.9 kg)  Body mass index is 25.33 kg/m.        Physical Examination:   General appearance: Well appearing, and in no distress  Mental status: Alert, oriented to person, place, and time  Skin: Warm & dry  Cardiovascular: Normal heart rate noted  Respiratory: Normal respiratory effort, no distress  Abdomen: Soft, gravid, nontender  Pelvic: Cervical exam deferred         Extremities: Edema: None  Fetal Status: Fetal Heart Rate (bpm): 125 Fundal Height: 25 cm Movement: Present    Results for orders placed or performed in visit on 12/29/17 (from the past 24 hour(s))  POC Urinalysis Dipstick OB   Collection Time: 12/29/17  4:18 PM  Result Value Ref Range   Color, UA     Clarity, UA     Glucose, UA Negative Negative   Bilirubin, UA     Ketones, UA neg      Spec Grav, UA     Blood, UA neg    pH, UA     POC Protein UA Negative Negative, Trace   Urobilinogen, UA     Nitrite, UA neg    Leukocytes, UA Negative Negative   Appearance     Odor      Assessment & Plan:  1) Low-risk pregnancy G2P0010 at 181w6d with an Estimated Date of Delivery: 03/10/18   2) Recent nosebleeds, x 20mins, large amount, discussed getting CBC for platelets- wants to hold off at this time. To get humidifier and run in house   3) Uterine size < dates- 1st time measurements are off, she is self-pay, discussed getting u/s now vs. rechecking FH at next visit, and if still off getting u/s then- she prefers the latter. Discussed the potential for FGR, oligo  4) [employer] Optimized Pt> is checking BP's weekly as directed. Next appt at 34wks    Meds: No orders of the defined types were placed in this encounter.  Labs/procedures today: none  Plan:  Continue routine obstetrical care   Reviewed: Preterm labor symptoms and general obstetric precautions including but not limited to vaginal bleeding, contractions, leaking of fluid and fetal movement were reviewed in detail with the patient.  All questions were answered  Follow-up: Return in about 4 weeks (around 01/26/2018) for LROB.  Orders Placed This Encounter  Procedures  . POC Urinalysis Dipstick OB   Cheral MarkerKimberly R Jettson Crable CNM, [facility] HealthcareWHNP-BC 12/29/2017 4:40 PM

## 2017-12-29 NOTE — Patient Instructions (Addendum)
[name], I greatly value your feedback.  If you receive a survey following your visit with [place] today, we appreciate you taking the time to fill it out.  Thanks, Joellyn Haff, CNM, WHNP-BC   Call the office [phone]) or go to [facility] if:  You begin to have strong, frequent contractions  Your water breaks.  Sometimes it is a big gush of fluid, sometimes it is just a trickle that keeps getting your panties wet or running down your legs  You have vaginal bleeding.  It is normal to have a small amount of spotting if your cervix was checked.   You don't feel your baby moving like normal.  If you don't, get you something to eat and drink and lay down and focus on feeling your baby move.  You should feel at least 10 movements in 2 hours.  If you don't, you should call the office or go to [facility].    Tdap Vaccine  It is recommended that you get the Tdap vaccine during the third trimester of EACH pregnancy to help protect your baby from getting pertussis (whooping cough)  27-36 weeks is the BEST time to do this so that you can pass the protection on to your baby. During pregnancy is better than after pregnancy, but if you are unable to get it during pregnancy it will be offered at the hospital.   You can get this vaccine with [place], at the health department, your family doctor, or some local pharmacies  Everyone who will be around your baby should also be up-to-date on their vaccines before the baby comes. Adults (who are not pregnant) only need 1 dose of Tdap during adulthood.   Third Trimester of Pregnancy The third trimester is from week 29 through week 42, months 7 through 9. The third trimester is a time when the fetus is growing rapidly. At the end of the ninth month, the fetus is about 20 inches in length and weighs 6-10 pounds.  BODY CHANGES Your body goes through many changes during pregnancy. The changes vary from woman to woman.   Your weight will continue to  increase. You can expect to gain 25-35 pounds (11-16 kg) by the end of the pregnancy.  You may begin to get stretch marks on your hips, abdomen, and breasts.  You may urinate more often because the fetus is moving lower into your pelvis and pressing on your bladder.  You may develop or continue to have heartburn as a result of your pregnancy.  You may develop constipation because certain hormones are causing the muscles that push waste through your intestines to slow down.  You may develop hemorrhoids or swollen, bulging veins (varicose veins).  You may have pelvic pain because of the weight gain and pregnancy hormones relaxing your joints between the bones in your pelvis. Backaches may result from overexertion of the muscles supporting your posture.  You may have changes in your hair. These can include thickening of your hair, rapid growth, and changes in texture. Some women also have hair loss during or after pregnancy, or hair that feels dry or thin. Your hair will most likely return to normal after your baby is born.  Your breasts will continue to grow and be tender. A yellow discharge may leak from your breasts called colostrum.  Your belly button may stick out.  You may feel short of breath because of your expanding uterus.  You may notice the fetus "dropping," or moving lower  in your abdomen.  You may have a bloody mucus discharge. This usually occurs a few days to a week before labor begins.  Your cervix becomes thin and soft (effaced) near your due date. WHAT TO EXPECT AT YOUR PRENATAL EXAMS  You will have prenatal exams every 2 weeks until week 36. Then, you will have weekly prenatal exams. During a routine prenatal visit:  You will be weighed to make sure you and the fetus are growing normally.  Your blood pressure is taken.  Your abdomen will be measured to track your baby's growth.  The fetal heartbeat will be listened to.  Any test results from the previous visit  will be discussed.  You may have a cervical check near your due date to see if you have effaced. At around 36 weeks, your caregiver will check your cervix. At the same time, your caregiver will also perform a test on the secretions of the vaginal tissue. This test is to determine if a type of bacteria, Group B streptococcus, is present. Your caregiver will explain this further. Your caregiver may ask you:  What your birth plan is.  How you are feeling.  If you are feeling the baby move.  If you have had any abnormal symptoms, such as leaking fluid, bleeding, severe headaches, or abdominal cramping.  If you have any questions. Other tests or screenings that may be performed during your third trimester include:  Blood tests that check for low iron levels (anemia).  Fetal testing to check the health, activity level, and growth of the fetus. Testing is done if you have certain medical conditions or if there are problems during the pregnancy. FALSE LABOR You may feel small, irregular contractions that eventually go away. These are called Braxton Hicks contractions, or false labor. Contractions may last for hours, days, or even weeks before true labor sets in. If contractions come at regular intervals, intensify, or become painful, it is best to be seen by your caregiver.  SIGNS OF LABOR   Menstrual-like cramps.  Contractions that are 5 minutes apart or less.  Contractions that start on the top of the uterus and spread down to the lower abdomen and back.  A sense of increased pelvic pressure or back pain.  A watery or bloody mucus discharge that comes from the vagina. If you have any of these signs before the 37th week of pregnancy, call your caregiver right away. You need to go to the hospital to get checked immediately. HOME CARE INSTRUCTIONS   Avoid all smoking, herbs, alcohol, and unprescribed drugs. These chemicals affect the formation and growth of the baby.  Follow your  caregiver's instructions regarding medicine use. There are medicines that are either safe or unsafe to take during pregnancy.  Exercise only as directed by your caregiver. Experiencing uterine cramps is a good sign to stop exercising.  Continue to eat regular, healthy meals.  Wear a good support bra for breast tenderness.  Do not use hot tubs, steam rooms, or saunas.  Wear your seat belt at all times when driving.  Avoid raw meat, uncooked cheese, cat litter boxes, and soil used by cats. These carry germs that can cause birth defects in the baby.  Take your prenatal vitamins.  Try taking a stool softener (if your caregiver approves) if you develop constipation. Eat more high-fiber foods, such as fresh vegetables or fruit and whole grains. Drink plenty of fluids to keep your urine clear or pale yellow.  Take warm sitz baths   to soothe any pain or discomfort caused by hemorrhoids. Use hemorrhoid cream if your caregiver approves.  If you develop varicose veins, wear support hose. Elevate your feet for 15 minutes, 3-4 times a day. Limit salt in your diet.  Avoid heavy lifting, wear low heal shoes, and practice good posture.  Rest a lot with your legs elevated if you have leg cramps or low back pain.  Visit your dentist if you have not gone during your pregnancy. Use a soft toothbrush to brush your teeth and be gentle when you floss.  A sexual relationship may be continued unless your caregiver directs you otherwise.  Do not travel far distances unless it is absolutely necessary and only with the approval of your caregiver.  Take prenatal classes to understand, practice, and ask questions about the labor and delivery.  Make a trial run to the hospital.  Pack your hospital bag.  Prepare the baby's nursery.  Continue to go to all your prenatal visits as directed by your caregiver. SEEK MEDICAL CARE IF:  You are unsure if you are in labor or if your water has broken.  You have  dizziness.  You have mild pelvic cramps, pelvic pressure, or nagging pain in your abdominal area.  You have persistent nausea, vomiting, or diarrhea.  You have a bad smelling vaginal discharge.  You have pain with urination. SEEK IMMEDIATE MEDICAL CARE IF:   You have a fever.  You are leaking fluid from your vagina.  You have spotting or bleeding from your vagina.  You have severe abdominal cramping or pain.  You have rapid weight loss or gain.  You have shortness of breath with chest pain.  You notice sudden or extreme swelling of your face, hands, ankles, feet, or legs.  You have not felt your baby move in over an hour.  You have severe headaches that do not go away with medicine.  You have vision changes. Document Released: 03/23/2001 Document Revised: 04/03/2013 Document Reviewed: 05/30/2012 [facility] Patient Information 2015 [employer], [place]. This information is not intended to replace advice given to you by your health care provider. Make sure you discuss any questions you have with your health care provider.  PROTECT YOURSELF & YOUR BABY FROM THE FLU! Because you are pregnant, we at [facility], along with the Centers for Disease Control (CDC), recommend that you receive the flu vaccine to protect yourself and your baby from the flu. The flu is more likely to cause severe illness in pregnant women than in women of reproductive age who are not pregnant. Changes in the immune system, heart, and lungs during pregnancy make pregnant women (and women up to two weeks postpartum) more prone to severe illness from flu, including illness resulting in hospitalization. Flu also may be harmful for a pregnant woman's developing baby. A common flu symptom is fever, which may be associated with neural tube defects and other adverse outcomes for a developing baby. Getting vaccinated can also help protect a baby after birth from flu. (Mom passes antibodies onto the developing baby during her  pregnancy.)  A Flu Vaccine is the Best Protection Against Flu Getting a flu vaccine is the first and most important step in protecting against flu. Pregnant women should get a flu shot and not the live attenuated influenza vaccine (LAIV), also known as nasal spray flu vaccine. Flu vaccines given during pregnancy help protect both the mother and her baby from flu. Vaccination has been shown to reduce the risk of flu-associated acute  respiratory infection in pregnant women by up to one-half. A 2018 study showed that getting a flu shot reduced a pregnant woman's risk of being hospitalized with flu by an average of 40 percent. Pregnant women who get a flu vaccine are also helping to protect their babies from flu illness for the first several months after their birth, when they are too young to get vaccinated.   A Long Record of Safety for Flu Shots in Pregnant Women Flu shots have been given to millions of pregnant women over many years with a good safety record. There is a lot of evidence that flu vaccines can be given safely during pregnancy; though these data are limited for the first trimester. The CDC recommends that pregnant women get vaccinated during any trimester of their pregnancy. It is very important for pregnant women to get the flu shot.   Other Preventive Actions In addition to getting a flu shot, pregnant women should take the same everyday preventive actions the CDC recommends of everyone, including covering coughs, washing hands often, and avoiding people who are sick.  Symptoms and Treatment If you get sick with flu symptoms call your doctor right away. There are antiviral drugs that can treat flu illness and prevent serious flu complications. The CDC recommends prompt treatment for people who have influenza infection or suspected influenza infection and who are at high risk of serious flu complications, such as people with asthma, diabetes (including gestational diabetes), or heart  disease. Early treatment of influenza in hospitalized pregnant women has been shown to reduce the length of the hospital stay.  Symptoms Flu symptoms include fever, cough, sore throat, runny or stuffy nose, body aches, headache, chills and fatigue. Some people may also have vomiting and diarrhea. People may be infected with the flu and have respiratory symptoms without a fever.  Early Treatment is Important for Pregnant Women Treatment should begin as soon as possible because antiviral drugs work best when started early (within 48 hours after symptoms start). Antiviral drugs can make your flu illness milder and make you feel better faster. They may also prevent serious health problems that can result from flu illness. Oral oseltamivir (Tamiflu) is the preferred treatment for pregnant women because it has the most studies available to suggest that it is safe and beneficial. Antiviral drugs require a prescription from your provider. Having a fever caused by flu infection or other infections early in pregnancy may be linked to birth defects in a baby. In addition to taking antiviral drugs, pregnant women who get a fever should treat their fever with Tylenol (acetaminophen) and contact their provider immediately.  When to Seek Emergency Medical Care If you are pregnant and have any of these signs, seek care immediately:  Difficulty breathing or shortness of breath  Pain or pressure in the chest or abdomen  Sudden dizziness  Confusion  Severe or persistent vomiting  High fever that is not responding to Tylenol (or store brand equivalent)  Decreased or no movement of your baby  [email or web address]

## 2018-01-26 ENCOUNTER — Ambulatory Visit (INDEPENDENT_AMBULATORY_CARE_PROVIDER_SITE_OTHER): Payer: Medicaid Other | Admitting: Advanced Practice Midwife

## 2018-01-26 ENCOUNTER — Other Ambulatory Visit: Payer: Self-pay

## 2018-01-26 ENCOUNTER — Encounter: Payer: Self-pay | Admitting: Advanced Practice Midwife

## 2018-01-26 ENCOUNTER — Other Ambulatory Visit (INDEPENDENT_AMBULATORY_CARE_PROVIDER_SITE_OTHER): Payer: Medicaid Other

## 2018-01-26 VITALS — BP 138/73 | HR 93 | Wt 153.0 lb

## 2018-01-26 DIAGNOSIS — Z3A33 33 weeks gestation of pregnancy: Secondary | ICD-10-CM | POA: Diagnosis not present

## 2018-01-26 DIAGNOSIS — O26843 Uterine size-date discrepancy, third trimester: Secondary | ICD-10-CM

## 2018-01-26 DIAGNOSIS — Z331 Pregnant state, incidental: Secondary | ICD-10-CM | POA: Diagnosis not present

## 2018-01-26 DIAGNOSIS — Z1389 Encounter for screening for other disorder: Secondary | ICD-10-CM | POA: Diagnosis not present

## 2018-01-26 DIAGNOSIS — Z3483 Encounter for supervision of other normal pregnancy, third trimester: Secondary | ICD-10-CM

## 2018-01-26 DIAGNOSIS — Z3403 Encounter for supervision of normal first pregnancy, third trimester: Secondary | ICD-10-CM

## 2018-01-26 LAB — POCT URINALYSIS DIPSTICK OB
Blood, UA: NEGATIVE
GLUCOSE, UA: NEGATIVE
KETONES UA: NEGATIVE
Leukocytes, UA: NEGATIVE
Nitrite, UA: NEGATIVE
POC,PROTEIN,UA: NEGATIVE

## 2018-01-26 NOTE — Patient Instructions (Signed)

## 2018-01-26 NOTE — Progress Notes (Signed)
[place] 33+6 WKS,frank breech,fundal placenta gr 2,normal ovaries bilat,fhr 140 bpm,afi 18 cm,EFW 2176 g 29%

## 2018-01-26 NOTE — Progress Notes (Signed)
LOW-RISK PREGNANCY VISIT Patient name: Erin Watson MRN [id number]  Date of birth: [birth year] Chief Complaint:   Routine Prenatal Visit  History of Present Illness:   Erin Watson is a 22 y.o. G52P0010 female at [redacted]w[redacted]d with an Estimated Date of Delivery: 03/10/18 being seen today for ongoing management of a low-risk pregnancy.  Today she reports no complaints. Contractions: Not present. Vag. Bleeding: None.  Movement: Present. denies leaking of fluid. Review of Systems:   Pertinent items are noted in HPI Denies abnormal vaginal discharge w/ itching/odor/irritation, headaches, visual changes, shortness of breath, chest pain, abdominal pain, severe nausea/vomiting, or problems with urination or bowel movements unless otherwise stated above.  Pertinent History Reviewed:  Medical & Surgical Hx:   Past Medical History:  Diagnosis Date  . Contraceptive management 12/28/2013  . Irregular bleeding 12/28/2013  . Medical history non-contributory   . Miscarriage    Past Surgical History:  Procedure Laterality Date  . WISDOM TOOTH EXTRACTION     Family History  Problem Relation Age of Onset  . Hypertension Father   . Cancer Maternal Aunt        skin  . Diabetes Maternal Grandmother   . Diabetes Paternal Grandmother   . Heart disease Paternal Grandmother   . Cancer Paternal Grandfather     Current Outpatient Medications:  .  Prenatal Vit-Fe Fumarate-FA (MULTIVITAMIN-PRENATAL) 27-0.8 MG TABS tablet, Take 1 tablet by mouth daily at 12 noon., Disp: , Rfl:  .  potassium chloride (KLOR-CON) 20 MEQ packet, Take 40 mEq by mouth daily for 2 doses., Disp: 4 packet, Rfl: 0 Social History: Reviewed -  reports that she has quit smoking. Her smoking use included cigarettes. She has never used smokeless tobacco.  Physical Assessment:   Vitals:   01/26/18 1530  BP: 138/73  Pulse: 93  Weight: 153 lb (69.4 kg)  Body mass index is 27.1 kg/m.        Physical Examination:   General  appearance: Well appearing, and in no distress  Mental status: Alert, oriented to person, place, and time  Skin: Warm & dry  Cardiovascular: Normal heart rate noted  Respiratory: Normal respiratory effort, no distress  Abdomen: Soft, gravid, nontender  Pelvic: Cervical exam deferred         Extremities: Edema: None  Fetal Status:     Movement: Present     Results for orders placed or performed in visit on 01/26/18 (from the past 24 hour(s))  POC Urinalysis Dipstick OB   Collection Time: 01/26/18  3:29 PM  Result Value Ref Range   Color, UA     Clarity, UA     Glucose, UA Negative Negative   Bilirubin, UA     Ketones, UA neg    Spec Grav, UA     Blood, UA neg    pH, UA     POC Protein UA Negative Negative, Trace   Urobilinogen, UA     Nitrite, UA neg    Leukocytes, UA Negative Negative   Appearance     Odor      Assessment & Plan:  1) Low-risk pregnancy G2P0010 at [redacted]w[redacted]d with an Estimated Date of Delivery: 03/10/18   2) ,size<dates   Labs/procedures/US today: [place] 33+6 WKS,frank breech,fundal placenta gr 2,normal ovaries bilat,fhr 140 bpm,afi 18 cm,EFW 2176 g 29%  Plan:  Continue routine obstetrical care    Follow-up: Return in about 3 weeks (around 02/16/2018) for LROB. CHECK POSITION  Orders Placed This Encounter  Procedures  .  POC Urinalysis Dipstick OB   [name] CNM 01/26/2018 3:37 PM

## 2018-02-09 ENCOUNTER — Ambulatory Visit (INDEPENDENT_AMBULATORY_CARE_PROVIDER_SITE_OTHER): Payer: Medicaid Other | Admitting: Advanced Practice Midwife

## 2018-02-09 ENCOUNTER — Telehealth: Payer: Self-pay | Admitting: *Deleted

## 2018-02-09 ENCOUNTER — Ambulatory Visit (INDEPENDENT_AMBULATORY_CARE_PROVIDER_SITE_OTHER): Payer: Medicaid Other | Admitting: *Deleted

## 2018-02-09 ENCOUNTER — Other Ambulatory Visit: Payer: Self-pay | Admitting: Advanced Practice Midwife

## 2018-02-09 VITALS — BP 142/90 | HR 93 | Wt 158.0 lb

## 2018-02-09 DIAGNOSIS — Z331 Pregnant state, incidental: Secondary | ICD-10-CM | POA: Diagnosis not present

## 2018-02-09 DIAGNOSIS — O133 Gestational [pregnancy-induced] hypertension without significant proteinuria, third trimester: Secondary | ICD-10-CM | POA: Diagnosis not present

## 2018-02-09 DIAGNOSIS — O134 Gestational [pregnancy-induced] hypertension without significant proteinuria, complicating childbirth: Secondary | ICD-10-CM

## 2018-02-09 DIAGNOSIS — Z1389 Encounter for screening for other disorder: Secondary | ICD-10-CM

## 2018-02-09 LAB — POCT URINALYSIS DIPSTICK OB
Glucose, UA: NEGATIVE
Ketones, UA: NEGATIVE
LEUKOCYTES UA: NEGATIVE
NITRITE UA: NEGATIVE
PROTEIN: NEGATIVE
RBC UA: NEGATIVE

## 2018-02-09 NOTE — Telephone Encounter (Signed)
Pt's mom called. Pt is checking her BP four times a day. BP earlier was 118/91. Baby scripts advised to do a 5 question assessment and recheck in 10 minutes. BP then was 151/96. Pt feels shakey. Hands and feet are swelling. Pt to come by office for BP check per Tish, RN. JSY

## 2018-02-09 NOTE — Patient Instructions (Signed)
Hypertension During Pregnancy °Hypertension, commonly called high blood pressure, is when the force of blood pumping through your arteries is too strong. Arteries are blood vessels that carry blood from the heart throughout the body. Hypertension during pregnancy can cause problems for you and your baby. Your baby may be born early (prematurely) or may not weigh as much as he or she should at birth. Very bad cases of hypertension during pregnancy can be life-threatening. °Different types of hypertension can occur during pregnancy. These include: °· Chronic hypertension. This happens when: °? You have hypertension before pregnancy and it continues during pregnancy. °? You develop hypertension before you are [redacted] weeks pregnant, and it continues during pregnancy. °· Gestational hypertension. This is hypertension that develops after the 20th week of pregnancy. °· Preeclampsia, also called toxemia of pregnancy. This is a very serious type of hypertension that develops only during pregnancy. It affects the whole body, and it can be very dangerous for you and your baby. ° °Gestational hypertension and preeclampsia usually go away within 6 weeks after your baby is born. Women who have hypertension during pregnancy have a greater chance of developing hypertension later in life or during future pregnancies. °What are the causes? °The exact cause of hypertension is not known. °What increases the risk? °There are certain factors that make it more likely for you to develop hypertension during pregnancy. These include: °· Having hypertension during a previous pregnancy or prior to pregnancy. °· Being overweight. °· Being older than age 40. °· Being pregnant for the first time or being pregnant with more than one baby. °· Becoming pregnant using fertilization methods such as IVF (in vitro fertilization). °· Having diabetes, kidney problems, or systemic lupus erythematosus. °· Having a family history of hypertension. ° °What are the  signs or symptoms? °Chronic hypertension and gestational hypertension rarely cause symptoms. Preeclampsia causes symptoms, which may include: °· Increased protein in your urine. Your health care provider will check for this at every visit before you give birth (prenatal visit). °· Severe headaches. °· Sudden weight gain. °· Swelling of the hands, face, legs, and feet. °· Nausea and vomiting. °· Vision problems, such as blurred or double vision. °· Numbness in the face, arms, legs, and feet. °· Dizziness. °· Slurred speech. °· Sensitivity to bright lights. °· Abdominal pain. °· Convulsions. ° °How is this diagnosed? °You may be diagnosed with hypertension during a routine prenatal exam. At each prenatal visit, you may: °· Have a urine test to check for high amounts of protein in your urine. °· Have your blood pressure checked. A blood pressure reading is recorded as two numbers, such as "120 over 80" (or 120/80). The first ("top") number is called the systolic pressure. It is a measure of the pressure in your arteries when your heart beats. The second ("bottom") number is called the diastolic pressure. It is a measure of the pressure in your arteries as your heart relaxes between beats. Blood pressure is measured in a unit called mm Hg. A normal blood pressure reading is: °? Systolic: below 120. °? Diastolic: below 80. ° °The type of hypertension that you are diagnosed with depends on your test results and when your symptoms developed. °· Chronic hypertension is usually diagnosed before 20 weeks of pregnancy. °· Gestational hypertension is usually diagnosed after 20 weeks of pregnancy. °· Hypertension with high amounts of protein in the urine is diagnosed as preeclampsia. °· Blood pressure measurements that stay above 160 systolic, or above 110 diastolic, are   signs of severe preeclampsia. ° °How is this treated? °Treatment for hypertension during pregnancy varies depending on the type of hypertension you have and how  serious it is. °· If you take medicines called ACE inhibitors to treat chronic hypertension, you may need to switch medicines. ACE inhibitors should not be taken during pregnancy. °· If you have gestational hypertension, you may need to take blood pressure medicine. °· If you are at risk for preeclampsia, your health care provider may recommend that you take a low-dose aspirin every day to prevent high blood pressure during your pregnancy. °· If you have severe preeclampsia, you may need to be hospitalized so you and your baby can be monitored closely. You may also need to take medicine (magnesium sulfate) to prevent seizures and to lower blood pressure. This medicine may be given as an injection or through an IV tube. °· In some cases, if your condition gets worse, you may need to deliver your baby early. ° °Follow these instructions at home: °Eating and drinking °· Drink enough fluid to keep your urine clear or pale yellow. °· Eat a healthy diet that is low in salt (sodium). Do not add salt to your food. Check food labels to see how much sodium a food or beverage contains. °Lifestyle °· Do not use any products that contain nicotine or tobacco, such as cigarettes and e-cigarettes. If you need help quitting, ask your health care provider. °· Do not use alcohol. °· Avoid caffeine. °· Avoid stress as much as possible. Rest and get plenty of sleep. °General instructions °· Take over-the-counter and prescription medicines only as told by your health care provider. °· While lying down, lie on your left side. This keeps pressure off your baby. °· While sitting or lying down, raise (elevate) your feet. Try putting some pillows under your lower legs. °· Exercise regularly. Ask your health care provider what kinds of exercise are best for you. °· Keep all prenatal and follow-up visits as told by your health care provider. This is important. °Contact a health care provider if: °· You have symptoms that your health care  provider told you may require more treatment or monitoring, such as: °? Fever. °? Vomiting. °? Headache. °Get help right away if: °· You have severe abdominal pain or vomiting that does not get better with treatment. °· You suddenly develop swelling in your hands, ankles, or face. °· You gain 4 lbs (1.8 kg) or more in 1 week. °· You develop vaginal bleeding, or you have blood in your urine. °· You do not feel your baby moving as much as usual. °· You have blurred or double vision. °· You have muscle twitching or sudden tightening (spasms). °· You have shortness of breath. °· Your lips or fingernails turn blue. °This information is not intended to replace advice given to you by your health care provider. Make sure you discuss any questions you have with your health care provider. °Document Released: 12/15/2010 Document Revised: 10/17/2015 Document Reviewed: 09/12/2015 °[employer] Interactive Patient Education © 2018 [employer] ° °

## 2018-02-09 NOTE — Progress Notes (Signed)
WORK IN FOR BP CHECK after ^ BP in [employer] (151/96) at 15.23 today.   141/96 and 141/92 today in office  Doesn't technically meet criteria for GHTN, but will assume that it's going to evolve and start twice weekly testing. EFW 2 weeks ago was 29%  FU Monday for BP check/NSt THursday for BPP/Dopplers

## 2018-02-09 NOTE — Progress Notes (Signed)
Pt in for bp check. She reports that she had high reading at home of 151/96. She reports headache last night, none today. Baby is  Moving well.

## 2018-02-10 LAB — COMPREHENSIVE METABOLIC PANEL
A/G RATIO: 1.7 (ref 1.2–2.2)
ALT: 18 IU/L (ref 0–32)
AST: 19 IU/L (ref 0–40)
Albumin: 4.2 g/dL (ref 3.5–5.5)
Alkaline Phosphatase: 123 IU/L — ABNORMAL HIGH (ref 39–117)
BUN/Creatinine Ratio: 7 — ABNORMAL LOW (ref 9–23)
BUN: 4 mg/dL — ABNORMAL LOW (ref 6–20)
Bilirubin Total: 0.5 mg/dL (ref 0.0–1.2)
CALCIUM: 9.3 mg/dL (ref 8.7–10.2)
CO2: 18 mmol/L — AB (ref 20–29)
CREATININE: 0.55 mg/dL — AB (ref 0.57–1.00)
Chloride: 101 mmol/L (ref 96–106)
GFR, EST AFRICAN AMERICAN: 155 mL/min/{1.73_m2} (ref >59)
GFR, EST NON AFRICAN AMERICAN: 135 mL/min/{1.73_m2} (ref >59)
Globulin, Total: 2.5 g/dL (ref 1.5–4.5)
Glucose: 88 mg/dL (ref 65–99)
POTASSIUM: 3.7 mmol/L (ref 3.5–5.2)
Sodium: 137 mmol/L (ref 134–144)
TOTAL PROTEIN: 6.7 g/dL (ref 6.0–8.5)

## 2018-02-10 LAB — PROTEIN / CREATININE RATIO, URINE
CREATININE, UR: 34.8 mg/dL
Protein, Ur: 6.2 mg/dL
Protein/Creat Ratio: 178 mg/g creat (ref 0–200)

## 2018-02-10 LAB — CBC
HEMOGLOBIN: 12.1 g/dL (ref 11.1–15.9)
Hematocrit: 35.5 % (ref 34.0–46.6)
MCH: 28.6 pg (ref 26.6–33.0)
MCHC: 34.1 g/dL (ref 31.5–35.7)
MCV: 84 fL (ref 79–97)
Platelets: 228 10*3/uL (ref 150–450)
RBC: 4.23 x10E6/uL (ref 3.77–5.28)
RDW: 11.8 % — ABNORMAL LOW (ref 12.3–15.4)
WBC: 11.7 10*3/uL — ABNORMAL HIGH (ref 3.4–10.8)

## 2018-02-12 ENCOUNTER — Encounter (HOSPITAL_COMMUNITY): Payer: Self-pay

## 2018-02-12 ENCOUNTER — Inpatient Hospital Stay (HOSPITAL_COMMUNITY)
Admission: AD | Admit: 2018-02-12 | Discharge: 2018-02-12 | Disposition: A | Discharge status: Home / Self Care | Payer: Medicaid Other | Source: Ambulatory Visit | Attending: Obstetrics and Gynecology | Admitting: Obstetrics and Gynecology

## 2018-02-12 DIAGNOSIS — G44209 Tension-type headache, unspecified, not intractable: Secondary | ICD-10-CM | POA: Diagnosis not present

## 2018-02-12 DIAGNOSIS — G44201 Tension-type headache, unspecified, intractable: Secondary | ICD-10-CM | POA: Diagnosis not present

## 2018-02-12 DIAGNOSIS — E876 Hypokalemia: Secondary | ICD-10-CM | POA: Insufficient documentation

## 2018-02-12 DIAGNOSIS — O133 Gestational [pregnancy-induced] hypertension without significant proteinuria, third trimester: Secondary | ICD-10-CM | POA: Insufficient documentation

## 2018-02-12 DIAGNOSIS — Z3A36 36 weeks gestation of pregnancy: Secondary | ICD-10-CM | POA: Diagnosis not present

## 2018-02-12 DIAGNOSIS — O99283 Endocrine, nutritional and metabolic diseases complicating pregnancy, third trimester: Secondary | ICD-10-CM | POA: Insufficient documentation

## 2018-02-12 LAB — PROTEIN / CREATININE RATIO, URINE
Creatinine, Urine: 37 mg/dL
Total Protein, Urine: <6 mg/dL

## 2018-02-12 LAB — CBC
HCT: 36.1 % (ref 36.0–46.0)
Hemoglobin: 11.9 g/dL — ABNORMAL LOW (ref 12.0–15.0)
MCH: 28.6 pg (ref 26.0–34.0)
MCHC: 33 g/dL (ref 30.0–36.0)
MCV: 86.8 fL (ref 80.0–100.0)
Platelets: 223 10*3/uL (ref 150–400)
RBC: 4.16 MIL/uL (ref 3.87–5.11)
RDW: 12.8 % (ref 11.5–15.5)
WBC: 12 10*3/uL — ABNORMAL HIGH (ref 4.0–10.5)
nRBC: 0 % (ref 0.0–0.2)

## 2018-02-12 LAB — COMPREHENSIVE METABOLIC PANEL
ALT: 21 U/L (ref 0–44)
AST: 21 U/L (ref 15–41)
Albumin: 3.2 g/dL — ABNORMAL LOW (ref 3.5–5.0)
Alkaline Phosphatase: 106 U/L (ref 38–126)
Anion gap: 10 (ref 5–15)
BUN: 6 mg/dL (ref 6–20)
CO2: 21 mmol/L — ABNORMAL LOW (ref 22–32)
Calcium: 9.5 mg/dL (ref 8.9–10.3)
Chloride: 103 mmol/L (ref 98–111)
Creatinine, Ser: 0.49 mg/dL (ref 0.44–1.00)
GFR calc Af Amer: >60 mL/min (ref >60)
GFR calc non Af Amer: >60 mL/min (ref >60)
Glucose, Bld: 95 mg/dL (ref 70–99)
Potassium: 3.3 mmol/L — ABNORMAL LOW (ref 3.5–5.1)
Sodium: 134 mmol/L — ABNORMAL LOW (ref 135–145)
Total Bilirubin: 0.8 mg/dL (ref 0.3–1.2)
Total Protein: 6.5 g/dL (ref 6.5–8.1)

## 2018-02-12 LAB — URINALYSIS, ROUTINE W REFLEX MICROSCOPIC
Bilirubin Urine: NEGATIVE
Glucose, UA: NEGATIVE mg/dL
Hgb urine dipstick: NEGATIVE
Ketones, ur: NEGATIVE mg/dL
Leukocytes, UA: NEGATIVE
Nitrite: NEGATIVE
Protein, ur: NEGATIVE mg/dL
Specific Gravity, Urine: 1.005 (ref 1.005–1.030)
pH: 7 (ref 5.0–8.0)

## 2018-02-12 MED ORDER — ACETAMINOPHEN 500 MG PO TABS
1000.0000 mg | ORAL_TABLET | Freq: Once | ORAL | Status: AC
  Administered 2018-02-12: 1000 mg via ORAL
  Filled 2018-02-12: qty 2

## 2018-02-12 NOTE — MAU Provider Note (Addendum)
History     CSN: [id number]  Arrival date and time: 02/12/18 2032   First Provider Initiated Contact with Patient 02/12/18 2121      Chief Complaint  Patient presents with  . Hypertension   Erin Watson is a 22 y.o. G2P0 at [redacted]w[redacted]d who presents to MAU for Hypertension. She reports being diagnosed with Hypertension on 10/31 and was told to check BP 2x day. Took BP around 1900 and noted to be 157/93. She reports HA being associated with hypertension but denies vision changes or RUQ abdominal pain. Reports HA started prior to arrival to MAU, frontal HA rates pain 6/10- has not taken any medication for HA. She denies lower abdominal cramping/contractions, vaginal discharge, vaginal bleeding or N/V. Reports +FM. Reports increased back pain over the past 2 days, denies being checked in the office. Reports baby being breech on last examination.    OB History    Gravida  2   Para  0   Term      Preterm      AB  1   Living  0     SAB  1   TAB      Ectopic      Multiple      Live Births              Past Medical History:  Diagnosis Date  . Contraceptive management 12/28/2013  . Irregular bleeding 12/28/2013  . Medical history non-contributory   . Miscarriage     Past Surgical History:  Procedure Laterality Date  . WISDOM TOOTH EXTRACTION      Family History  Problem Relation Age of Onset  . Hypertension Father   . Cancer Maternal Aunt        skin  . Diabetes Maternal Grandmother   . Diabetes Paternal Grandmother   . Heart disease Paternal Grandmother   . Cancer Paternal Grandfather     Social History   Tobacco Use  . Smoking status: Former Smoker    Types: Cigarettes  . Smokeless tobacco: Never Used  Substance Use Topics  . Alcohol use: No  . Drug use: No    Allergies: No Known Allergies  Medications Prior to Admission  Medication Sig Dispense Refill Last Dose  . Prenatal Vit-Fe Fumarate-FA (MULTIVITAMIN-PRENATAL) 27-0.8 MG TABS tablet Take 1  tablet by mouth daily at 12 noon.   Past Week at Unknown time  . potassium chloride (KLOR-CON) 20 MEQ packet Take 40 mEq by mouth daily for 2 doses. 4 packet 0     Review of Systems  Constitutional:       Positive for hypertension  Respiratory: Negative.   Cardiovascular: Negative.   Gastrointestinal: Negative.   Genitourinary: Negative.   Musculoskeletal: Positive for back pain.  Neurological: Positive for headaches. Negative for dizziness, syncope and light-headedness.   Physical Exam   02/12/18 2201  -  94  -  19  139/82  Sitting  -  -  - AC   02/12/18 2146  -  96  -  -  139/83  -  -  -  - AC   02/12/18 2131  -  90  -  -  146/91Abnormal   -  -  -  - AC   02/12/18 2116  -  86  -  -  136/83  -  -  -  - AC   02/12/18 2101  -  101Abnormal   -  -  146/88Abnormal   -  -  -  -  [facility]   02/12/18 2058  -  91  -  -  141/87Abnormal   -  -  -  - AC   02/12/18 2042  98.3 F (36.8 C)  86  -  19  140/81  Sitting  98 %  -  70.3 kg DS    Physical Exam  Nursing note and vitals reviewed. Constitutional: She is oriented to person, place, and time. She appears well-developed and well-nourished. No distress.  Cardiovascular: Normal rate, regular rhythm and normal heart sounds.  Respiratory: Effort normal and breath sounds normal. No respiratory distress. She has no wheezes.  GI:  Gravid, no contractions palpated   Musculoskeletal: Normal range of motion. She exhibits no edema.  Neurological: She is alert and oriented to person, place, and time. She displays normal reflexes. She exhibits normal muscle tone.  Psychiatric: She has a normal mood and affect. Her behavior is normal. Thought content normal.   Dilation: Closed Cervical Position: Posterior Presentation: Transverse Exam by:: Steward Drone, CNM  FHR: 125/ moderate/ +accels/ no decelerations  Toco: occasional mild contractions   MAU Course  Procedures  MDM Orders Placed This Encounter  Procedures  . Urinalysis, Routine w reflex  microscopic  . CBC  . Comprehensive metabolic panel  . Protein / creatinine ratio, urine   Treatments in MAU included 1,000mg  Tylenol for HA and back pain. Patient reports HA relieved after medication  Results for orders placed or performed during the hospital encounter of 02/12/18 (from the past 24 hour(s))  Urinalysis, Routine w reflex microscopic     Status: None   Collection Time: 02/12/18  8:50 PM  Result Value Ref Range   Color, Urine YELLOW YELLOW   APPearance CLEAR CLEAR   Specific Gravity, Urine 1.005 1.005 - 1.030   pH 7.0 5.0 - 8.0   Glucose, UA NEGATIVE NEGATIVE mg/dL   Hgb urine dipstick NEGATIVE NEGATIVE   Bilirubin Urine NEGATIVE NEGATIVE   Ketones, ur NEGATIVE NEGATIVE mg/dL   Protein, ur NEGATIVE NEGATIVE mg/dL   Nitrite NEGATIVE NEGATIVE   Leukocytes, UA NEGATIVE NEGATIVE  Protein / creatinine ratio, urine     Status: None   Collection Time: 02/12/18  8:50 PM  Result Value Ref Range   Creatinine, Urine 37.00 mg/dL   Total Protein, Urine <6.00 mg/dL   Protein Creatinine Ratio        0.00 - 0.15 mg/mg[Cre]  CBC     Status: Abnormal   Collection Time: 02/12/18  9:14 PM  Result Value Ref Range   WBC 12.0 (H) 4.0 - 10.5 K/uL   RBC 4.16 3.87 - 5.11 MIL/uL   Hemoglobin 11.9 (L) 12.0 - 15.0 g/dL   HCT 16.1 09.6 - 04.5 %   MCV 86.8 80.0 - 100.0 fL   MCH 28.6 26.0 - 34.0 pg   MCHC 33.0 30.0 - 36.0 g/dL   RDW 40.9 81.1 - 91.4 %   Platelets 223 150 - 400 K/uL   nRBC 0.0 0.0 - 0.2 %  Comprehensive metabolic panel     Status: Abnormal   Collection Time: 02/12/18  9:14 PM  Result Value Ref Range   Sodium 134 (L) 135 - 145 mmol/L   Potassium 3.3 (L) 3.5 - 5.1 mmol/L   Chloride 103 98 - 111 mmol/L   CO2 21 (L) 22 - 32 mmol/L   Glucose, Bld 95 70 - 99 mg/dL   BUN 6 6 - 20 mg/dL   Creatinine, Ser 7.82 0.44 - 1.00 mg/dL  Calcium 9.5 8.9 - 10.3 mg/dL   Total Protein 6.5 6.5 - 8.1 g/dL   Albumin 3.2 (L) 3.5 - 5.0 g/dL   AST 21 15 - 41 U/L   ALT 21 0 - 44 U/L    Alkaline Phosphatase 106 38 - 126 U/L   Total Bilirubin 0.8 0.3 - 1.2 mg/dL   GFR calc non Af Amer >60 >60 mL/min   GFR calc Af Amer >60 >60 mL/min   Anion gap 10 5 - 15   PEC labs reviewed, PEC labs normal. Dx patient with Gestational Hypertension. BS US performed to assess fetal position, noted transverse lie. Educated and discussed GHTN, discussed reasons to return to MAU and precautions given. Patient has appointment in office tomorrow for BP check and NST. Follow up as scheduled, educated on version and procedure. Pt stable at time of discharge.   Assessment and Plan   1. Gestational hypertension, third trimester   2. [redacted] weeks gestation of pregnancy   3. Hypokalemia   4. Acute intractable tension-type headache    Discharge home  Continue potasium supplementation  PEC precautions  Return to MAU as needed  Follow up as scheduled   Follow-up Information    [facility]. Go on 02/13/2018.   Specialty:  Obstetrics and Gynecology Why:  Follow up as scheduled for appointments this week  Contact information: [address] [place] [address] [phone]         Allergies as of 02/12/2018   No Known Allergies     Medication List    TAKE these medications   multivitamin-prenatal 27-0.8 MG Tabs tablet Take 1 tablet by mouth daily at 12 noon.   potassium chloride 20 MEQ packet Commonly known as:  KLOR-CON Take 40 mEq by mouth daily for 2 doses.       Sharyon Cable CNM 02/12/2018, 10:13 PM

## 2018-02-12 NOTE — MAU Note (Signed)
Pt states she took BP around 7pm and it was 157/93. States she has a HA that started not long after she took her BP. Has not taken anything for it. Rates 6/10. Pt also reports back pain that for the past several days.

## 2018-02-13 ENCOUNTER — Other Ambulatory Visit: Payer: Self-pay

## 2018-02-13 ENCOUNTER — Ambulatory Visit (INDEPENDENT_AMBULATORY_CARE_PROVIDER_SITE_OTHER): Payer: Medicaid Other | Admitting: Women's Health

## 2018-02-13 ENCOUNTER — Encounter: Payer: Self-pay | Admitting: Women's Health

## 2018-02-13 VITALS — BP 138/88 | HR 97 | Wt 153.0 lb

## 2018-02-13 DIAGNOSIS — Z3483 Encounter for supervision of other normal pregnancy, third trimester: Secondary | ICD-10-CM

## 2018-02-13 DIAGNOSIS — Z1389 Encounter for screening for other disorder: Secondary | ICD-10-CM | POA: Diagnosis not present

## 2018-02-13 DIAGNOSIS — Z3A36 36 weeks gestation of pregnancy: Secondary | ICD-10-CM | POA: Diagnosis not present

## 2018-02-13 DIAGNOSIS — O133 Gestational [pregnancy-induced] hypertension without significant proteinuria, third trimester: Secondary | ICD-10-CM | POA: Diagnosis not present

## 2018-02-13 DIAGNOSIS — O0993 Supervision of high risk pregnancy, unspecified, third trimester: Secondary | ICD-10-CM

## 2018-02-13 DIAGNOSIS — Z331 Pregnant state, incidental: Secondary | ICD-10-CM | POA: Diagnosis not present

## 2018-02-13 DIAGNOSIS — O0990 Supervision of high risk pregnancy, unspecified, unspecified trimester: Secondary | ICD-10-CM

## 2018-02-13 DIAGNOSIS — O321XX0 Maternal care for breech presentation, not applicable or unspecified: Secondary | ICD-10-CM

## 2018-02-13 DIAGNOSIS — O139 Gestational [pregnancy-induced] hypertension without significant proteinuria, unspecified trimester: Secondary | ICD-10-CM | POA: Insufficient documentation

## 2018-02-13 LAB — POCT URINALYSIS DIPSTICK OB
Blood, UA: NEGATIVE
Glucose, UA: NEGATIVE
Ketones, UA: NEGATIVE
LEUKOCYTES UA: NEGATIVE
NITRITE UA: NEGATIVE
PROTEIN: NEGATIVE

## 2018-02-13 NOTE — Patient Instructions (Signed)
Erin Watson, I greatly value your feedback.  If you receive a survey following your visit with [place] today, we appreciate you taking the time to fill it out.  Thanks, Joellyn Haff, CNM, WHNP-BC   Call the office [phone]) or go to [facility] if:  You begin to have strong, frequent contractions  Your water breaks.  Sometimes it is a big gush of fluid, sometimes it is just a trickle that keeps getting your panties wet or running down your legs  You have vaginal bleeding.  It is normal to have a small amount of spotting if your cervix was checked.   You don't feel your baby moving like normal.  If you don't, get you something to eat and drink and lay down and focus on feeling your baby move.  You should feel at least 10 movements in 2 hours.  If you don't, you should call the office or go to [facility].

## 2018-02-13 NOTE — Progress Notes (Signed)
HIGH-RISK PREGNANCY VISIT Patient name: Erin Watson MRN [id number]  Date of birth: 04-25-1995 Chief Complaint:   High Risk Gestation (NST; gbs/gc)  History of Present Illness:   Erin Watson is a 22 y.o. G56P0010 female at [redacted]w[redacted]d with an Estimated Date of Delivery: 03/10/18 being seen today for ongoing management of a high-risk pregnancy complicated by [facility] dx last night in MAU, pre-e labs normal, had bad headache that was resolved by APAP.  Today she reports no complaints. Contractions: Not present. Vag. Bleeding: None.  Movement: Present. denies leaking of fluid.  Review of Systems:   Pertinent items are noted in HPI Denies abnormal vaginal discharge w/ itching/odor/irritation, headaches, visual changes, shortness of breath, chest pain, abdominal pain, severe nausea/vomiting, or problems with urination or bowel movements unless otherwise stated above. Pertinent History Reviewed:  Reviewed past medical,surgical, social, obstetrical and family history.  Reviewed problem list, medications and allergies. Physical Assessment:   Vitals:   02/13/18 1421  BP: 138/88  Pulse: 97  Weight: 153 lb (69.4 kg)  Body mass index is 27.1 kg/m.           Physical Examination:   General appearance: alert, well appearing, and in no distress  Mental status: alert, oriented to person, place, and time  Skin: warm & dry   Extremities: Edema: None    Cardiovascular: normal heart rate noted  Respiratory: normal respiratory effort, no distress  Abdomen: gravid, soft, non-tender  Pelvic: Cervical exam deferred         Fetal Status: Fetal Heart Rate (bpm): 110   Movement: Present Presentation: Homero Fellers Breech  Fetal Surveillance Testing today: NST: FHR baseline 110 bpm, Variability: moderate, Accelerations:present, Decelerations:  Absent= Cat 1/Reactive Toco: none Reviewed w/ LHE d/t lower baseline, no concerns     Results for orders placed or performed in visit on 02/13/18 (from the past 24  hour(s))  POC Urinalysis Dipstick OB   Collection Time: 02/13/18  2:21 PM  Result Value Ref Range   Color, UA     Clarity, UA     Glucose, UA Negative Negative   Bilirubin, UA     Ketones, UA neg    Spec Grav, UA     Blood, UA neg    pH, UA     POC Protein UA Negative Negative, Trace   Urobilinogen, UA     Nitrite, UA neg    Leukocytes, UA Negative Negative   Appearance     Odor    Results for orders placed or performed during the hospital encounter of 02/12/18 (from the past 24 hour(s))  Urinalysis, Routine w reflex microscopic   Collection Time: 02/12/18  8:50 PM  Result Value Ref Range   Color, Urine YELLOW YELLOW   APPearance CLEAR CLEAR   Specific Gravity, Urine 1.005 1.005 - 1.030   pH 7.0 5.0 - 8.0   Glucose, UA NEGATIVE NEGATIVE mg/dL   Hgb urine dipstick NEGATIVE NEGATIVE   Bilirubin Urine NEGATIVE NEGATIVE   Ketones, ur NEGATIVE NEGATIVE mg/dL   Protein, ur NEGATIVE NEGATIVE mg/dL   Nitrite NEGATIVE NEGATIVE   Leukocytes, UA NEGATIVE NEGATIVE  Protein / creatinine ratio, urine   Collection Time: 02/12/18  8:50 PM  Result Value Ref Range   Creatinine, Urine 37.00 mg/dL   Total Protein, Urine <6.00 mg/dL   Protein Creatinine Ratio        0.00 - 0.15 mg/mg[Cre]  CBC   Collection Time: 02/12/18  9:14 PM  Result Value Ref Range  WBC 12.0 (H) 4.0 - 10.5 K/uL   RBC 4.16 3.87 - 5.11 MIL/uL   Hemoglobin 11.9 (L) 12.0 - 15.0 g/dL   HCT 16.1 09.6 - 04.5 %   MCV 86.8 80.0 - 100.0 fL   MCH 28.6 26.0 - 34.0 pg   MCHC 33.0 30.0 - 36.0 g/dL   RDW 40.9 81.1 - 91.4 %   Platelets 223 150 - 400 K/uL   nRBC 0.0 0.0 - 0.2 %  Comprehensive metabolic panel   Collection Time: 02/12/18  9:14 PM  Result Value Ref Range   Sodium 134 (L) 135 - 145 mmol/L   Potassium 3.3 (L) 3.5 - 5.1 mmol/L   Chloride 103 98 - 111 mmol/L   CO2 21 (L) 22 - 32 mmol/L   Glucose, Bld 95 70 - 99 mg/dL   BUN 6 6 - 20 mg/dL   Creatinine, Ser 7.82 0.44 - 1.00 mg/dL   Calcium 9.5 8.9 - 95.6 mg/dL     Total Protein 6.5 6.5 - 8.1 g/dL   Albumin 3.2 (L) 3.5 - 5.0 g/dL   AST 21 15 - 41 U/L   ALT 21 0 - 44 U/L   Alkaline Phosphatase 106 38 - 126 U/L   Total Bilirubin 0.8 0.3 - 1.2 mg/dL   GFR calc non Af Amer >60 >60 mL/min   GFR calc Af Amer >60 >60 mL/min   Anion gap 10 5 - 15    Assessment & Plan:  1) High-risk pregnancy G2P0010 at [redacted]w[redacted]d with an Estimated Date of Delivery: 03/10/18   2) GHTN, stable, bp high-normal today. Pre-e labs normal. Asymptomatic. Discussed pre-e s/s, reasons to seek care  3) Breech presentation, discussed ECV vs. C/S, wants ECV, scheduled for Fri 11/8 @ 0800, npo after mn. IOL or C/S to follow that day if bp remains elevated  Meds: No orders of the defined types were placed in this encounter.  Labs/procedures today: nst, gbs, gc/ct  Treatment Plan:  Efw/afi/bpp/dopp u/s Thurs, ECV w/ IOL or C/S to follow on Friday ECV form sent to L&D, did not place orders for IOL in case unsuccessful and needs C/S  Reviewed: Preterm labor symptoms and general obstetric precautions including but not limited to vaginal bleeding, contractions, leaking of fluid and fetal movement were reviewed in detail with the patient.  All questions were answered.  Follow-up: Return for thurs for hrob, bpp/dopp/efw u/s.  Orders Placed This Encounter  Procedures  . Culture, beta strep (group b only)  . GC/Chlamydia Probe Amp  . US OB Follow Up  . US FETAL BPP WO NON STRESS  . [place] UA Cord Doppler  . POC Urinalysis Dipstick OB   Cheral Marker CNM, [facility] 02/13/2018 4:32 PM

## 2018-02-13 NOTE — Treatment Plan (Signed)
ECV Assessment Scheduling Form Fax to [facility]:  [phone]  Erin Watson                                                                                   DOB:  05/27/1995                                                            MRN:  [id number]                                                                     Phone #:   [phone]                         Provider:  [facility]  GP:  G2P0010                                                            Estimated Date of Delivery: 03/10/18  Dating Criteria: LMP c/w 10wk u/s    Medical Indications for ECV:  breech Admission Date/Time:  11/8 @ 0800 Gestational age on admission:  37.0  Will have IOL or C/S to follow, depending on successfulness of ECV   [facility] Weights   02/13/18 1421  Weight: 153 lb (69.4 kg)   HIV:  Non Reactive (08/22 0845) GBS:  collected today  SVE: not performed today    Scheduling Provider Signature:  Cheral Marker, CNM                                            Today's Date:  02/13/2018

## 2018-02-14 ENCOUNTER — Telehealth: Payer: Self-pay | Admitting: Advanced Practice Midwife

## 2018-02-14 ENCOUNTER — Other Ambulatory Visit: Payer: Self-pay | Admitting: Advanced Practice Midwife

## 2018-02-14 NOTE — Telephone Encounter (Signed)
Sandie w/[employer] called, requested that we give the patient a call, they just got her BP which is 123/94.  [phone]

## 2018-02-15 ENCOUNTER — Telehealth: Payer: Self-pay | Admitting: Obstetrics & Gynecology

## 2018-02-15 ENCOUNTER — Telehealth (HOSPITAL_COMMUNITY): Payer: Self-pay | Admitting: *Deleted

## 2018-02-15 LAB — GC/CHLAMYDIA PROBE AMP
Chlamydia trachomatis, NAA: NEGATIVE
NEISSERIA GONORRHOEAE BY PCR: NEGATIVE

## 2018-02-15 NOTE — Telephone Encounter (Signed)
Preadmission screen  

## 2018-02-15 NOTE — Telephone Encounter (Signed)
Sandy with [employer] called, stated that they have a critical reading for this patient 131/101.  [phone]

## 2018-02-16 ENCOUNTER — Encounter: Payer: Self-pay | Admitting: Advanced Practice Midwife

## 2018-02-16 ENCOUNTER — Other Ambulatory Visit: Payer: Self-pay

## 2018-02-16 ENCOUNTER — Encounter (HOSPITAL_COMMUNITY): Payer: Self-pay | Admitting: *Deleted

## 2018-02-16 ENCOUNTER — Ambulatory Visit (INDEPENDENT_AMBULATORY_CARE_PROVIDER_SITE_OTHER): Payer: Medicaid Other

## 2018-02-16 ENCOUNTER — Ambulatory Visit (INDEPENDENT_AMBULATORY_CARE_PROVIDER_SITE_OTHER): Payer: Medicaid Other | Admitting: Advanced Practice Midwife

## 2018-02-16 ENCOUNTER — Telehealth (HOSPITAL_COMMUNITY): Payer: Self-pay | Admitting: *Deleted

## 2018-02-16 VITALS — BP 137/79 | HR 94 | Wt 159.0 lb

## 2018-02-16 DIAGNOSIS — Z3A36 36 weeks gestation of pregnancy: Secondary | ICD-10-CM

## 2018-02-16 DIAGNOSIS — O133 Gestational [pregnancy-induced] hypertension without significant proteinuria, third trimester: Secondary | ICD-10-CM

## 2018-02-16 DIAGNOSIS — O321XX0 Maternal care for breech presentation, not applicable or unspecified: Secondary | ICD-10-CM

## 2018-02-16 DIAGNOSIS — O0993 Supervision of high risk pregnancy, unspecified, third trimester: Secondary | ICD-10-CM | POA: Diagnosis not present

## 2018-02-16 DIAGNOSIS — O0990 Supervision of high risk pregnancy, unspecified, unspecified trimester: Secondary | ICD-10-CM

## 2018-02-16 DIAGNOSIS — Z1389 Encounter for screening for other disorder: Secondary | ICD-10-CM | POA: Diagnosis not present

## 2018-02-16 DIAGNOSIS — Z331 Pregnant state, incidental: Secondary | ICD-10-CM | POA: Diagnosis not present

## 2018-02-16 LAB — POCT URINALYSIS DIPSTICK OB
Blood, UA: NEGATIVE
GLUCOSE, UA: NEGATIVE
KETONES UA: NEGATIVE
LEUKOCYTES UA: NEGATIVE
Nitrite, UA: NEGATIVE
POC,PROTEIN,UA: NEGATIVE

## 2018-02-16 NOTE — Progress Notes (Signed)
  G2P0010 [redacted]w[redacted]d Estimated Date of Delivery: 03/10/18  Blood pressure 137/79, pulse 94, weight 159 lb (72.1 kg), last menstrual period 06/03/2017.    Dx w/GHTN last week, does home BP monitoring, none in severe range  BP weight and urine results all reviewed and noted.  Please refer to the obstetrical flow sheet for the fundal height and fetal heart rate documentation:  Patient reports good fetal movement, denies any bleeding and no rupture of membranes symptoms or regular contractions. Patient is without complaints. All questions were answered.   Physical Assessment:   Vitals:   02/16/18 1532  BP: 137/79  Pulse: 94  Weight: 159 lb (72.1 kg)  Body mass index is 28.17 kg/m.        Physical Examination:   General appearance: Well appearing, and in no distress  Mental status: Alert, oriented to person, place, and time  Skin: Warm & dry  Cardiovascular: Normal heart rate noted  Respiratory: Normal respiratory effort, no distress  Abdomen: Soft, gravid, nontender  Pelvic: Cervical exam deferred         Extremities: Edema: None   [place] 36+6 wks,frank breech,fundal placenta gr 3,afi 18 cm,normal ovaries bilat,BPP 8/8,FHR 130 BPM,EFW 2935 g 44%,RI .69,.73..73=97%,elevated UAD w/diastolic flow  Fetal Status:     Movement: Present    Results for orders placed or performed in visit on 02/16/18 (from the past 24 hour(s))  POC Urinalysis Dipstick OB   Collection Time: 02/16/18  3:31 PM  Result Value Ref Range   Color, UA     Clarity, UA     Glucose, UA Negative Negative   Bilirubin, UA     Ketones, UA neg    Spec Grav, UA     Blood, UA neg    pH, UA     POC,PROTEIN,UA Negative Negative, Trace   Urobilinogen, UA     Nitrite, UA neg    Leukocytes, UA Negative Negative   Appearance     Odor       Orders Placed This Encounter  Procedures  . POC Urinalysis Dipstick OB    Plan:  Continued routine obstetrical care, appt in am for ECV and IOL/CS if unsuccessful  Return in  about 11 days (around 02/27/2018) for BP check.

## 2018-02-16 NOTE — Progress Notes (Signed)
[place] 36+6 wks,frank breech,fundal placenta gr 3,afi 18 cm,normal ovaries bilat,BPP 8/8,FHR 130 BPM,EFW 2935 g 44%,RI .69,.73..73=97%,elevated UAD w/diastolic flow

## 2018-02-16 NOTE — Telephone Encounter (Signed)
Preadmission screen  

## 2018-02-17 ENCOUNTER — Observation Stay (HOSPITAL_COMMUNITY): Payer: Medicaid Other | Admitting: Anesthesiology

## 2018-02-17 ENCOUNTER — Other Ambulatory Visit: Payer: Self-pay

## 2018-02-17 ENCOUNTER — Encounter (HOSPITAL_COMMUNITY)
Admission: RE | Disposition: A | Discharge status: Home / Self Care | Payer: Self-pay | Source: Home / Self Care | Attending: Obstetrics & Gynecology

## 2018-02-17 ENCOUNTER — Encounter (HOSPITAL_COMMUNITY): Payer: Self-pay

## 2018-02-17 ENCOUNTER — Inpatient Hospital Stay (HOSPITAL_COMMUNITY)
Admission: RE | Admit: 2018-02-17 | Discharge: 2018-02-19 | DRG: 788 | Disposition: A | Discharge status: Home / Self Care | Payer: Medicaid Other | Attending: Obstetrics & Gynecology | Admitting: Obstetrics & Gynecology

## 2018-02-17 DIAGNOSIS — O321XX0 Maternal care for breech presentation, not applicable or unspecified: Secondary | ICD-10-CM | POA: Diagnosis not present

## 2018-02-17 DIAGNOSIS — Z3A37 37 weeks gestation of pregnancy: Secondary | ICD-10-CM

## 2018-02-17 DIAGNOSIS — O134 Gestational [pregnancy-induced] hypertension without significant proteinuria, complicating childbirth: Secondary | ICD-10-CM | POA: Diagnosis present

## 2018-02-17 DIAGNOSIS — O9989 Other specified diseases and conditions complicating pregnancy, childbirth and the puerperium: Secondary | ICD-10-CM

## 2018-02-17 DIAGNOSIS — Z87891 Personal history of nicotine dependence: Secondary | ICD-10-CM

## 2018-02-17 DIAGNOSIS — D649 Anemia, unspecified: Secondary | ICD-10-CM | POA: Diagnosis present

## 2018-02-17 DIAGNOSIS — O139 Gestational [pregnancy-induced] hypertension without significant proteinuria, unspecified trimester: Secondary | ICD-10-CM | POA: Diagnosis present

## 2018-02-17 DIAGNOSIS — O09899 Supervision of other high risk pregnancies, unspecified trimester: Secondary | ICD-10-CM

## 2018-02-17 DIAGNOSIS — O9902 Anemia complicating childbirth: Secondary | ICD-10-CM | POA: Diagnosis present

## 2018-02-17 DIAGNOSIS — Z98891 History of uterine scar from previous surgery: Secondary | ICD-10-CM

## 2018-02-17 DIAGNOSIS — Z283 Underimmunization status: Secondary | ICD-10-CM

## 2018-02-17 DIAGNOSIS — O0990 Supervision of high risk pregnancy, unspecified, unspecified trimester: Secondary | ICD-10-CM

## 2018-02-17 LAB — CBC
HEMATOCRIT: 35.2 % — AB (ref 36.0–46.0)
HEMOGLOBIN: 11.4 g/dL — AB (ref 12.0–15.0)
MCH: 28.1 pg (ref 26.0–34.0)
MCHC: 32.4 g/dL (ref 30.0–36.0)
MCV: 86.9 fL (ref 80.0–100.0)
Platelets: 212 10*3/uL (ref 150–400)
RBC: 4.05 MIL/uL (ref 3.87–5.11)
RDW: 13 % (ref 11.5–15.5)
WBC: 9.4 10*3/uL (ref 4.0–10.5)
nRBC: 0 % (ref 0.0–0.2)

## 2018-02-17 LAB — TYPE AND SCREEN
ABO/RH(D): O POS
Antibody Screen: NEGATIVE

## 2018-02-17 LAB — CREATININE, SERUM
CREATININE: 0.56 mg/dL (ref 0.44–1.00)
GFR calc non Af Amer: >60 mL/min (ref >60)

## 2018-02-17 LAB — ABO/RH: ABO/RH(D): O POS

## 2018-02-17 LAB — CULTURE, BETA STREP (GROUP B ONLY): Strep Gp B Culture: NEGATIVE

## 2018-02-17 LAB — RPR: RPR: NONREACTIVE

## 2018-02-17 SURGERY — Surgical Case
Anesthesia: Epidural

## 2018-02-17 MED ORDER — DIBUCAINE 1 % RE OINT: 1.0000 "application " | TOPICAL_OINTMENT | RECTAL | Status: DC | PRN

## 2018-02-17 MED ORDER — NALBUPHINE HCL 10 MG/ML IJ SOLN: 5.0000 mg | Freq: Once | INTRAMUSCULAR | Status: DC | PRN

## 2018-02-17 MED ORDER — IBUPROFEN 600 MG PO TABS
600.0000 mg | ORAL_TABLET | Freq: Four times a day (QID) | ORAL | Status: DC
  Administered 2018-02-18 – 2018-02-19 (×7): 600 mg via ORAL
  Filled 2018-02-17 (×6): qty 1

## 2018-02-17 MED ORDER — SIMETHICONE 80 MG PO CHEW
80.0000 mg | CHEWABLE_TABLET | ORAL | Status: DC | PRN
  Administered 2018-02-18: 80 mg via ORAL

## 2018-02-17 MED ORDER — MENTHOL 3 MG MT LOZG: 1.0000 | LOZENGE | OROMUCOSAL | Status: DC | PRN

## 2018-02-17 MED ORDER — SOD CITRATE-CITRIC ACID 500-334 MG/5ML PO SOLN
ORAL | Status: AC
  Administered 2018-02-17: 30 mL
  Filled 2018-02-17: qty 15

## 2018-02-17 MED ORDER — ONDANSETRON HCL 4 MG/2ML IJ SOLN: 4.0000 mg | Freq: Three times a day (TID) | INTRAMUSCULAR | Status: DC | PRN

## 2018-02-17 MED ORDER — TETANUS-DIPHTH-ACELL PERTUSSIS 5-2.5-18.5 LF-MCG/0.5 IM SUSP: 0.5000 mL | Freq: Once | INTRAMUSCULAR | Status: DC

## 2018-02-17 MED ORDER — TERBUTALINE SULFATE 1 MG/ML IJ SOLN
INTRAMUSCULAR | Status: AC
  Filled 2018-02-17: qty 1

## 2018-02-17 MED ORDER — FENTANYL CITRATE (PF) 100 MCG/2ML IJ SOLN
INTRAMUSCULAR | Status: DC | PRN
  Administered 2018-02-17: 15 ug via INTRATHECAL

## 2018-02-17 MED ORDER — NALOXONE HCL 0.4 MG/ML IJ SOLN: 0.4000 mg | INTRAMUSCULAR | Status: DC | PRN

## 2018-02-17 MED ORDER — MEASLES, MUMPS & RUBELLA VAC IJ SOLR
0.5000 mL | Freq: Once | INTRAMUSCULAR | Status: DC
  Filled 2018-02-17: qty 0.5

## 2018-02-17 MED ORDER — CEFAZOLIN SODIUM-DEXTROSE 2-3 GM-%(50ML) IV SOLR
INTRAVENOUS | Status: DC | PRN
  Administered 2018-02-17: 2 g via INTRAVENOUS

## 2018-02-17 MED ORDER — WITCH HAZEL-GLYCERIN EX PADS: 1.0000 "application " | MEDICATED_PAD | CUTANEOUS | Status: DC | PRN

## 2018-02-17 MED ORDER — OXYTOCIN 40 UNITS IN LACTATED RINGERS INFUSION - SIMPLE MED: 2.5000 [IU]/h | INTRAVENOUS | Status: AC

## 2018-02-17 MED ORDER — OXYTOCIN 10 UNIT/ML IJ SOLN
INTRAVENOUS | Status: DC | PRN
  Administered 2018-02-17: 40 [IU] via INTRAVENOUS

## 2018-02-17 MED ORDER — LACTATED RINGERS IV SOLN
INTRAVENOUS | Status: DC
  Administered 2018-02-17 (×3): via INTRAVENOUS

## 2018-02-17 MED ORDER — FENTANYL CITRATE (PF) 100 MCG/2ML IJ SOLN
INTRAMUSCULAR | Status: AC
  Filled 2018-02-17: qty 2

## 2018-02-17 MED ORDER — OXYCODONE HCL 5 MG PO TABS: 5.0000 mg | ORAL_TABLET | ORAL | Status: DC | PRN

## 2018-02-17 MED ORDER — OXYCODONE HCL 5 MG PO TABS
10.0000 mg | ORAL_TABLET | ORAL | Status: DC | PRN
  Administered 2018-02-18: 10 mg via ORAL
  Filled 2018-02-17: qty 2

## 2018-02-17 MED ORDER — KETOROLAC TROMETHAMINE 30 MG/ML IJ SOLN: 30.0000 mg | Freq: Four times a day (QID) | INTRAMUSCULAR | Status: AC | PRN

## 2018-02-17 MED ORDER — DIPHENHYDRAMINE HCL 25 MG PO CAPS: 25.0000 mg | ORAL_CAPSULE | Freq: Four times a day (QID) | ORAL | Status: DC | PRN

## 2018-02-17 MED ORDER — ENOXAPARIN SODIUM 40 MG/0.4ML ~~LOC~~ SOLN
40.0000 mg | SUBCUTANEOUS | Status: DC
  Administered 2018-02-18: 40 mg via SUBCUTANEOUS
  Filled 2018-02-17: qty 0.4

## 2018-02-17 MED ORDER — SODIUM CHLORIDE 0.9% FLUSH: 3.0000 mL | INTRAVENOUS | Status: DC | PRN

## 2018-02-17 MED ORDER — SODIUM CHLORIDE 0.9 % IR SOLN
Status: DC | PRN
  Administered 2018-02-17: 1

## 2018-02-17 MED ORDER — PRENATAL MULTIVITAMIN CH
1.0000 | ORAL_TABLET | Freq: Every day | ORAL | Status: DC
  Administered 2018-02-18 – 2018-02-19 (×2): 1 via ORAL
  Filled 2018-02-17: qty 1

## 2018-02-17 MED ORDER — KETOROLAC TROMETHAMINE 30 MG/ML IJ SOLN
30.0000 mg | Freq: Four times a day (QID) | INTRAMUSCULAR | Status: AC | PRN
  Administered 2018-02-17: 30 mg via INTRAVENOUS
  Filled 2018-02-17: qty 1

## 2018-02-17 MED ORDER — BUPIVACAINE IN DEXTROSE 0.75-8.25 % IT SOLN
INTRATHECAL | Status: DC | PRN
  Administered 2018-02-17: 1.5 mL via INTRATHECAL

## 2018-02-17 MED ORDER — ONDANSETRON HCL 4 MG/2ML IJ SOLN
INTRAMUSCULAR | Status: DC | PRN
  Administered 2018-02-17: 4 mg via INTRAVENOUS

## 2018-02-17 MED ORDER — DIPHENHYDRAMINE HCL 50 MG/ML IJ SOLN: 12.5000 mg | INTRAMUSCULAR | Status: DC | PRN

## 2018-02-17 MED ORDER — STERILE WATER FOR IRRIGATION IR SOLN
Status: DC | PRN
  Administered 2018-02-17: 1000 mL

## 2018-02-17 MED ORDER — TERBUTALINE SULFATE 1 MG/ML IJ SOLN
0.2500 mg | Freq: Once | INTRAMUSCULAR | Status: AC
  Administered 2018-02-17: 0.25 mg via SUBCUTANEOUS
  Filled 2018-02-17: qty 1

## 2018-02-17 MED ORDER — MORPHINE SULFATE (PF) 0.5 MG/ML IJ SOLN
INTRAMUSCULAR | Status: DC | PRN
  Administered 2018-02-17: .15 mg via INTRATHECAL

## 2018-02-17 MED ORDER — PHENYLEPHRINE 8 MG IN D5W 100 ML (0.08MG/ML) PREMIX OPTIME
INJECTION | INTRAVENOUS | Status: DC | PRN
  Administered 2018-02-17: 60 ug/min via INTRAVENOUS

## 2018-02-17 MED ORDER — NALOXONE HCL 4 MG/10ML IJ SOLN
1.0000 ug/kg/h | INTRAVENOUS | Status: DC | PRN
  Filled 2018-02-17: qty 5

## 2018-02-17 MED ORDER — NALBUPHINE HCL 10 MG/ML IJ SOLN: 5.0000 mg | INTRAMUSCULAR | Status: DC | PRN

## 2018-02-17 MED ORDER — SENNOSIDES-DOCUSATE SODIUM 8.6-50 MG PO TABS
2.0000 | ORAL_TABLET | ORAL | Status: DC
  Administered 2018-02-18 (×2): 2 via ORAL
  Filled 2018-02-17 (×2): qty 2

## 2018-02-17 MED ORDER — FENTANYL CITRATE (PF) 100 MCG/2ML IJ SOLN: 25.0000 ug | INTRAMUSCULAR | Status: DC | PRN

## 2018-02-17 MED ORDER — MORPHINE SULFATE (PF) 0.5 MG/ML IJ SOLN
INTRAMUSCULAR | Status: AC
  Filled 2018-02-17: qty 10

## 2018-02-17 MED ORDER — SCOPOLAMINE 1 MG/3DAYS TD PT72
MEDICATED_PATCH | TRANSDERMAL | Status: DC | PRN
  Administered 2018-02-17: 1 via TRANSDERMAL

## 2018-02-17 MED ORDER — MEPERIDINE HCL 25 MG/ML IJ SOLN: 6.2500 mg | INTRAMUSCULAR | Status: DC | PRN

## 2018-02-17 MED ORDER — LACTATED RINGERS IV SOLN
INTRAVENOUS | Status: DC
  Administered 2018-02-18: 01:00:00 via INTRAVENOUS

## 2018-02-17 MED ORDER — LACTATED RINGERS IV SOLN
INTRAVENOUS | Status: DC | PRN
  Administered 2018-02-17: 14:00:00 via INTRAVENOUS

## 2018-02-17 MED ORDER — DEXAMETHASONE SODIUM PHOSPHATE 4 MG/ML IJ SOLN
INTRAMUSCULAR | Status: DC | PRN
  Administered 2018-02-17: 4 mg via INTRAVENOUS

## 2018-02-17 MED ORDER — COCONUT OIL OIL: 1.0000 "application " | TOPICAL_OIL | Status: DC | PRN

## 2018-02-17 MED ORDER — DIPHENHYDRAMINE HCL 25 MG PO CAPS: 25.0000 mg | ORAL_CAPSULE | ORAL | Status: DC | PRN

## 2018-02-17 MED ORDER — ACETAMINOPHEN 325 MG PO TABS: 650.0000 mg | ORAL_TABLET | ORAL | Status: DC | PRN

## 2018-02-17 MED ORDER — ZOLPIDEM TARTRATE 5 MG PO TABS: 5.0000 mg | ORAL_TABLET | Freq: Every evening | ORAL | Status: DC | PRN

## 2018-02-17 MED ORDER — SIMETHICONE 80 MG PO CHEW
80.0000 mg | CHEWABLE_TABLET | ORAL | Status: DC
  Administered 2018-02-18: 80 mg via ORAL
  Filled 2018-02-17 (×2): qty 1

## 2018-02-17 SURGICAL SUPPLY — 34 items
APL SKNCLS STERI-STRIP NONHPOA (GAUZE/BANDAGES/DRESSINGS) ×1
BANDAGE GAUZE 4  KLING STR (GAUZE/BANDAGES/DRESSINGS) ×1 IMPLANT
BENZOIN TINCTURE PRP APPL 2/3 (GAUZE/BANDAGES/DRESSINGS) ×1 IMPLANT
CHLORAPREP W/TINT 26ML (MISCELLANEOUS) ×2 IMPLANT
CLAMP CORD UMBIL (MISCELLANEOUS) IMPLANT
CLOSURE STERI-STRIP 1/4X4 (GAUZE/BANDAGES/DRESSINGS) ×1 IMPLANT
CLOTH BEACON ORANGE TIMEOUT ST (SAFETY) ×2 IMPLANT
DRAIN JACKSON PRT FLT 7MM (DRAIN) IMPLANT
DRSG OPSITE POSTOP 4X10 (GAUZE/BANDAGES/DRESSINGS) ×2 IMPLANT
ELECT REM PT RETURN 9FT ADLT (ELECTROSURGICAL) ×2
ELECTRODE REM PT RTRN 9FT ADLT (ELECTROSURGICAL) ×1 IMPLANT
EVACUATOR SILICONE 100CC (DRAIN) IMPLANT
EXTRACTOR VACUUM M CUP 4 TUBE (SUCTIONS) IMPLANT
GLOVE BIO SURGEON STRL SZ7 (GLOVE) ×2 IMPLANT
GLOVE BIOGEL PI IND STRL 7.0 (GLOVE) ×2 IMPLANT
GLOVE BIOGEL PI INDICATOR 7.0 (GLOVE) ×2
GOWN STRL REUS W/TWL LRG LVL3 (GOWN DISPOSABLE) ×4 IMPLANT
HEMOSTAT ARISTA ABSORB 3G PWDR (MISCELLANEOUS) ×1 IMPLANT
KIT ABG SYR 3ML LUER SLIP (SYRINGE) IMPLANT
NDL HYPO 25X5/8 SAFETYGLIDE (NEEDLE) ×1 IMPLANT
NEEDLE HYPO 25X5/8 SAFETYGLIDE (NEEDLE) ×2 IMPLANT
NS IRRIG 1000ML POUR BTL (IV SOLUTION) ×2 IMPLANT
PACK C SECTION WH (CUSTOM PROCEDURE TRAY) ×2 IMPLANT
PAD ABD 8X10 STRL (GAUZE/BANDAGES/DRESSINGS) ×1 IMPLANT
PAD OB MATERNITY 4.3X12.25 (PERSONAL CARE ITEMS) ×2 IMPLANT
PENCIL SMOKE EVAC W/HOLSTER (ELECTROSURGICAL) ×2 IMPLANT
RTRCTR C-SECT PINK 25CM LRG (MISCELLANEOUS) ×2 IMPLANT
SPONGE LAP 18X18 RF (DISPOSABLE) ×6 IMPLANT
SUT VIC AB 0 CTX 36 (SUTURE) ×10
SUT VIC AB 0 CTX36XBRD ANBCTRL (SUTURE) ×5 IMPLANT
SUT VIC AB 4-0 KS 27 (SUTURE) ×3 IMPLANT
SYR KIT LINE DRAW 1CC W/FILTR (LINER) ×2 IMPLANT
TOWEL OR 17X24 6PK STRL BLUE (TOWEL DISPOSABLE) ×2 IMPLANT
TRAY FOLEY W/BAG SLVR 14FR LF (SET/KITS/TRAYS/PACK) ×2 IMPLANT

## 2018-02-17 NOTE — Anesthesia Procedure Notes (Signed)
Spinal  Patient location during procedure: OR Start time: 02/17/2018 1:35 PM End time: 02/17/2018 1:38 PM Staffing Anesthesiologist: Mal Amabile, MD Performed: anesthesiologist  Preanesthetic Checklist Completed: patient identified, site marked, surgical consent, pre-op evaluation, timeout performed, IV checked, risks and benefits discussed and monitors and equipment checked Spinal Block Patient position: sitting Prep: site prepped and draped and DuraPrep Patient monitoring: heart rate, cardiac monitor, continuous pulse ox and blood pressure Approach: midline Location: L3-4 Injection technique: single-shot Needle Needle type: Pencan  Needle gauge: 24 G Needle length: 9 cm Needle insertion depth: 6 cm Assessment Sensory level: T4 Additional Notes Patient tolerated procedure well. Adequate sensory level.

## 2018-02-17 NOTE — Lactation Note (Signed)
This note was copied from a baby's chart. Lactation Consultation Note  Patient Name: Erin Watson [id number] Date: 02/17/2018 Reason for consult: Initial assessment;Primapara;1st time breastfeeding;Early term 82-38.6wks  Visited with P1 Mom of 37w 0d infant at 81 hrs old.  Baby delivered by C/S due to breech presentation and GHTN.    Baby being held by family member without blanket.  Mom instructed to keep baby STS as much as possible, giving reasons for this.  Baby appears very red (ruddy appearance).  Talked about normal newborn sleepiness, especially with babies born at 57 weeks.    Placed baby STS in football hold, and laid back position.  Baby not showing any cues.  Placed baby in the middle of Mom's chest with 2 blankets on him.    Offered to assist and teach how to do breast massage and hand expression.  Mom agreed.and LC demonstrated massage and expression.  Copious amounts of colostrum expressed.  Baby spoon fed 5 ml well.   Mom instructed to massage breasts and hand express every 2-3 hrs if baby isn't latching to breast.    Breast shells given with instructions on use.  Mom to put her nursing bra on.  Plan- 1- Keep baby STS as much as possible 2- Watch for cues, and offer breast (goal is >8 feedings per 24 hrs) 3- If baby isn't waking to breastfeed, hand express and spoon feed 3-5 ml at each feeding 4- Ask for assistance prn  5- If baby remains sleepy, will need to double pump by the time baby is 20-24 hrs old.  Lactation brochure given to Mom.  Mom aware of IP and OP lactation support available.   Interventions Interventions: Breast feeding basics reviewed;Assisted with latch;Skin to skin;Breast massage;Hand express;Pre-pump if needed;Breast compression;Adjust position;Support pillows;Position options;Expressed milk;Shells;Hand pump  Lactation Tools Discussed/Used WIC Program: No Pump Review: Setup, frequency, and cleaning;Milk Storage Initiated by:: Erby Pian RN  IBCLC Date initiated:: 02/17/18   Consult Status Consult Status: Follow-up Date: 02/18/18 Follow-up type: In-patient    Erin Watson 02/17/2018, 6:36 PM

## 2018-02-17 NOTE — Progress Notes (Addendum)
Erin Watson is a 22 y.o. G2P0010 at [redacted]w[redacted]d with breach and failed version.  Pt for primary c/s.  Pt has gestational HTN.    Subjective: No complaints  Objective: BP 133/80   Pulse (!) 107   Temp 98.2 F (36.8 C) (Oral)   Resp 17   Ht 5\' 3"  (1.6 m)   Wt 71.8 kg   LMP 06/03/2017   SpO2 98%   BMI 28.06 kg/m  No intake/output data recorded. No intake/output data recorded.  FHT:  FHR: 130 bpm, variability: moderate,  accelerations:  Present,  decelerations:  Absent UC:   none  Labs: Lab Results  Component Value Date   WBC 9.4 02/17/2018   HGB 11.4 (L) 02/17/2018   HCT 35.2 (L) 02/17/2018   MCV 86.9 02/17/2018   PLT 212 02/17/2018    Assessment / Plan:  22 yo female with gestational HTN and breach.  Elevated dopplers. Failed version Pt for primary c/s  The risks of cesarean section discussed with the patient included but were not limited to: bleeding which may require transfusion or reoperation; infection which may require antibiotics; injury to bowel, bladder, ureters or other surrounding organs; injury to the fetus; need for additional procedures including hysterectomy in the event of a life-threatening hemorrhage; placental abnormalities wth subsequent pregnancies, incisional problems, thromboembolic phenomenon and other postoperative/anesthesia complications. The patient concurred with the proposed plan, giving informed written consent for the procedure.    Erin Watson 02/17/2018, 1:15 PM

## 2018-02-17 NOTE — Brief Op Note (Signed)
02/17/2018  3:41 PM  PATIENT:  Erin Watson  22 y.o. female  PRE-OPERATIVE DIAGNOSIS:  Malpresentation  POST-OPERATIVE DIAGNOSIS:  Malpresentation   PROCEDURE:  Procedure(s): CESAREAN SECTION (N/A)  SURGEON:  Surgeon(s) and Role:    * Lesly Dukes, MD - Primary  ASSISTANTS: none   ANESTHESIA:   spinal  EBL:  417 mL   BLOOD ADMINISTERED:none  DRAINS: none   LOCAL MEDICATIONS USED:  NONE  SPECIMEN:  Source of Specimen:  Placenta  DISPOSITION OF SPECIMEN:  PATHOLOGY  COUNTS:  YES  TOURNIQUET:  * No tourniquets in log *  DICTATION: .Note written in EPIC  PLAN OF CARE: Admit to inpatient   PATIENT DISPOSITION:  PACU - hemodynamically stable.   Delay start of Pharmacological VTE agent (>24hrs) due to surgical blood loss or risk of bleeding: no

## 2018-02-17 NOTE — H&P (Signed)
Erin Watson is a 22 y.o. female presenting for ECV.  Pt breach today.   Pt also has Gestational HTN and elevated dopplers.  No symptoms today.    OB History    Gravida  2   Para  0   Term      Preterm      AB  1   Living  0     SAB  1   TAB      Ectopic      Multiple      Live Births             Past Medical History:  Diagnosis Date  . Contraceptive management 12/28/2013  . Irregular bleeding 12/28/2013  . Medical history non-contributory   . Miscarriage   . Pregnancy induced hypertension    Past Surgical History:  Procedure Laterality Date  . WISDOM TOOTH EXTRACTION     Family History: family history includes Cancer in her maternal aunt and paternal grandfather; Diabetes in her maternal grandmother and paternal grandmother; Heart disease in her paternal grandmother; Hypertension in her father. Social History:  reports that she has quit smoking. Her smoking use included cigarettes. She has never used smokeless tobacco. She reports that she does not drink alcohol or use drugs.     Maternal Diabetes: No Genetic Screening: Declined Maternal Ultrasounds/Referrals: Abnormal:  Findings:   Other: Fetal Ultrasounds or other Referrals:  None Maternal Substance Abuse:  No Significant Maternal Medications:  None Significant Maternal Lab Results:  None Other Comments:  None  Review of Systems  Constitutional: Negative.   Eyes: Negative.   Respiratory: Negative.   Cardiovascular: Negative.   Genitourinary: Negative.   Musculoskeletal: Negative.   Psychiatric/Behavioral: Negative.    History   Blood pressure 134/76, pulse 88, temperature 98.2 F (36.8 C), temperature source Oral, resp. rate 16, height 5\' 3"  (1.6 m), weight 71.8 kg, last menstrual period 06/03/2017. Exam Physical Exam  Vitals reviewed. Constitutional: She is oriented to person, place, and time. She appears well-developed and well-nourished. No distress.  HENT:  Head: Normocephalic and  atraumatic.  Eyes: Conjunctivae are normal.  Neck: Neck supple. No thyromegaly present.  Cardiovascular: Normal rate and regular rhythm.  Respiratory: Effort normal.  GI: Soft. There is no tenderness. There is no rebound and no guarding.  Musculoskeletal: Normal range of motion. She exhibits no tenderness.  Neurological: She is alert and oriented to person, place, and time.  Skin: Skin is warm and dry.  Psychiatric: She has a normal mood and affect.    Prenatal labs: ABO, Rh: --/--/O POS (11/08 4098) Antibody: NEG (11/08 1191) Rubella: 0.99 (05/30 1131) RPR: Non Reactive (08/22 0845)  HBsAg: Negative (05/30 1131)  HIV: Non Reactive (08/22 0845)  GBS:   negative  Assessment/Plan: 22 yo G1P0 at 37 weeks with gestational HTN, elevated dopplers, and breach presentation.  1. ECV today.  Pt consented for procedure and understands approx 50% success rate.  There is a risk of fetal distress and emergency c/s.  Terbultiline gvien.  NST reactive.    [name] 02/17/2018, 10:00 AM

## 2018-02-17 NOTE — Progress Notes (Signed)
Patient ID: GUSTAVIA CARIE, female   DOB: 1995-08-28, 22 y.o.   MRN: [id number]     External Cephalic Version  Preprocedure Diagnosis:  22 y.o. G1P0 at [redacted] weeks gestational age with breach presentation  Post-procedure Diagnosis: 22 y.o. G1P0 at [redacted] weeks gestational age with failed version attmept  Procedure:  Version attempt  Procedure in detail:   The patient was brought to Labor and Delivery where a reactive fetal heart tracing was obtained. The patient was noted to have irregular contractions. She was given 1 dose of subcutaneous terbutaline which resolved her contraction. A bedside ultrasound was performed which revealed single intrauterine pregnancy and frnak presentation. There was noted to be adequate fluid. Using manual pressure, the breech was manipulated in a forward roll fashion and backward roll fashion without movement of the vertex. Fetal heart tones were checked intermittently during the procedure and were noted to be reassuring. Following successful external cephalic version, the patient was placed on continuous external fetal monitoring. She was noted to have a reassuring and reactive tracing for 1 hour following the external cephalic version. Pt had gestational HTN and wil have C/S today.

## 2018-02-17 NOTE — Op Note (Signed)
02/17/2018  3:41 PM  PATIENT:  Erin Watson  22 y.o. female  PRE-OPERATIVE DIAGNOSIS:  Malpresentation  POST-OPERATIVE DIAGNOSIS:  Malpresentation   PROCEDURE:  Procedure(s): CESAREAN SECTION (N/A)  SURGEON:  Surgeon(s) and Role:    * Lesly Dukes, MD - Primary  ASSISTANTS: none   ANESTHESIA:   spinal  EBL:  417 mL   BLOOD ADMINISTERED:none  COMPLICATIONS: none immediate   LOCAL MEDICATIONS USED:  NONE  SPECIMEN:  Source of Specimen:  Placenta to pathology   PATIENT DISPOSITION:  PACU - hemodynamically stable.   INDICATIONS: Erin Watson is a 22 y.o. G2P0010 at [redacted]w[redacted]d  With gestational HTN and breach presentation .  The risks of cesarean section discussed with the patient included but were not limited to: bleeding which may require transfusion or reoperation; infection which may require antibiotics; injury to bowel, bladder, ureters or other surrounding organs; injury to the fetus; need for additional procedures including hysterectomy in the event of a life-threatening hemorrhage; placental abnormalities wth subsequent pregnancies, incisional problems, thromboembolic phenomenon and other postoperative/anesthesia complications. The patient concurred with the proposed plan, giving informed written consent for the procedure.    FINDINGS:  Viable female  infant in cephalic presentation, 9,10 Apgars, weight to be determined in 1 hour, clear amniotic fluid.  Intact placenta, three vessel cord.  Grossly normal uterus, ovaries and fallopian tubes. Marland Kitchen    PROCEDURE IN DETAIL:  The patient received intravenous antibiotics and had sequential compression devices applied to her lower extremities.  Spinal anesthesia was administered and was found to be adequate. She was then placed in a dorsal supine position with a leftward tilt, and prepped and draped in a sterile manner.  A foley catheter was placed into her bladder and attached to constant gravity.  After an adequate timeout was  performed, a Pfannenstiel skin incision was made with scalpel and carried through to the underlying layer of fascia. The fascia was incised in the midline and this incision was extended bilaterally using the Mayo scissors. Kocher clamps were applied to the superior aspect of the fascial incision and the underlying rectus muscles were dissected off bluntly. A similar process was carried out on the inferior aspect of the facial incision. The rectus muscles were separated in the midline bluntly and the peritoneum was entered bluntly.   A transverse hysterotomy was made with a scalpel and extended bilaterally bluntly. The bladder blade was then removed. The infant was successfully delivered, and cord was clamped and cut and infant was handed over to awaiting neonatology team. Uterine massage was then administered and the placenta delivered intact with three-vessel cord. The uterus was cleared of clot and debris.  The hysterotomy was closed with 0 vicryl.  A second imbricating suture of 0-Vicryl was used to reinforce the incision and aid in hemostasis.  Arista was applied to lower serosa to help with hemostasis.  The peritoneum and rectus muscles were noted to be hemostatic.  The peritoneum was closed with 0-Vicryl.  The fascia was closed with 0-Vicryl in a running fashion with good restoration of anatomy.  The subcutaneus tissue was copiously irrigated.  The skin was closed with 4-0 Vicryl in a subcuticular fashion.    Pt tolerated the procedure will.  All counts were correct x2.  Pt went to the recovery room in stable condition.

## 2018-02-17 NOTE — Transfer of Care (Signed)
Immediate Anesthesia Transfer of Care Note  Patient: Erin Watson  Procedure(s) Performed: CESAREAN SECTION (N/A )  Patient Location: PACU  Anesthesia Type:Spinal  Level of Consciousness: awake, alert  and oriented  Airway & Oxygen Therapy: Patient Spontanous Breathing  Post-op Assessment: Report given to RN and Post -op Vital signs reviewed and stable  Post vital signs: Reviewed and stable   82 sats 97% resp 15 BP 127/64  Last Vitals:  Vitals Value Taken Time  BP    Temp    Pulse    Resp    SpO2      Last Pain:  Vitals:   02/17/18 1318  TempSrc:   PainSc: 0-No pain         Complications: No apparent anesthesia complications

## 2018-02-17 NOTE — Anesthesia Preprocedure Evaluation (Signed)
Anesthesia Evaluation  Patient identified by MRN, date of birth, ID band Patient awake    Reviewed: Allergy & Precautions, NPO status , Patient's Chart, lab work & pertinent test results  Airway Mallampati: II  TM Distance: >3 FB Neck ROM: Full    Dental no notable dental hx. (+) Teeth Intact, Caps   Pulmonary former smoker,    Pulmonary exam normal breath sounds clear to auscultation       Cardiovascular hypertension, Normal cardiovascular exam Rhythm:Regular Rate:Normal     Neuro/Psych negative neurological ROS  negative psych ROS   GI/Hepatic Neg liver ROS, GERD  Medicated,  Endo/Other  negative endocrine ROS  Renal/GU negative Renal ROS  negative genitourinary   Musculoskeletal   Abdominal   Peds  Hematology  (+) anemia ,   Anesthesia Other Findings   Reproductive/Obstetrics (+) Pregnancy Gestational HTN Breech presentation 37 weeks                             Anesthesia Physical Anesthesia Plan  ASA: II  Anesthesia Plan: Epidural and Spinal   Post-op Pain Management:    Induction:   PONV Risk Score and Plan:   Airway Management Planned: Natural Airway  Additional Equipment:   Intra-op Plan:   Post-operative Plan:   Informed Consent: I have reviewed the patients History and Physical, chart, labs and discussed the procedure including the risks, benefits and alternatives for the proposed anesthesia with the patient or authorized representative who has indicated his/her understanding and acceptance.     Plan Discussed with: Surgeon and CRNA  Anesthesia Plan Comments: (Discussed anesthesia for both labor and vaginal delivery ( Epidural ) as well as for C/Section in the possibility that the external cephalic version is unsuccessful. Risks, Benefits and alternatives of each discussed in detail.)        Anesthesia Quick Evaluation

## 2018-02-17 NOTE — Progress Notes (Signed)
Delivery Note    Requested by Dr. Penne Lash to attend this primary C-section delivery at 37 0/[redacted] weeks GA due to breech presentation.   Born to a G2P0010 mother with pregnancy complicated bygestational hypertension; breech presentation & failed version .  AROM occurred at delivery with clear fluid.      Infant vigorous with good spontaneous cry.  Delayed cord clamping performed x 1 minute.   Routine NRP followed including warming, drying and stimulation.  Apgars 9 / 10.  Physical exam within normal limits.   Left in OR for skin-to-skin contact with mother, in care of CN staff.  Care transferred to Pediatrician.  Alix Stowers NNP-BC

## 2018-02-17 NOTE — Anesthesia Postprocedure Evaluation (Signed)
Anesthesia Post Note  Patient: Erin Watson  Procedure(s) Performed: CESAREAN SECTION (N/A )     Patient location during evaluation: PACU Anesthesia Type: Spinal Level of consciousness: oriented and awake and alert Pain management: pain level controlled Vital Signs Assessment: post-procedure vital signs reviewed and stable Respiratory status: spontaneous breathing, respiratory function stable and nonlabored ventilation Cardiovascular status: blood pressure returned to baseline and stable Postop Assessment: no headache, no backache, no apparent nausea or vomiting, patient able to bend at knees and spinal receding Anesthetic complications: no    Last Vitals:  Vitals:   02/17/18 1515 02/17/18 1530  BP: 130/61 (!) 130/45  Pulse: 72 74  Resp: (!) 21 15  Temp:    SpO2: 100% 99%    Last Pain:  Vitals:   02/17/18 1530  TempSrc:   PainSc: 0-No pain   Pain Goal:                 Hajar Penninger A.

## 2018-02-18 ENCOUNTER — Encounter (HOSPITAL_COMMUNITY): Payer: Self-pay

## 2018-02-18 LAB — CBC
HCT: 31.8 % — ABNORMAL LOW (ref 36.0–46.0)
Hemoglobin: 10.5 g/dL — ABNORMAL LOW (ref 12.0–15.0)
MCH: 28.6 pg (ref 26.0–34.0)
MCHC: 33 g/dL (ref 30.0–36.0)
MCV: 86.6 fL (ref 80.0–100.0)
PLATELETS: 236 10*3/uL (ref 150–400)
RBC: 3.67 MIL/uL — ABNORMAL LOW (ref 3.87–5.11)
RDW: 13 % (ref 11.5–15.5)
WBC: 15.6 10*3/uL — ABNORMAL HIGH (ref 4.0–10.5)
nRBC: 0 % (ref 0.0–0.2)

## 2018-02-18 NOTE — Lactation Note (Addendum)
This note was copied from a baby's chart. Lactation Consultation Note  Patient Name: Boy Raylene Carmickle [id number] Date: 02/18/2018   Baby 37 weeks 22 hours and has been sleepy. Reviewed LPI feeding behavior. Mother has been able to hand express good volume of colostrum to supplement infant. Mom knows to pump q3h for 15-20 min. Reviewed cleaning and milk storage. Mother pumped 5 ml.  Taught FOB baby how to finger syringe feed. Mom has my # to call for assist w/next feeding.  Mother called for assistance.  Mother hand expresed good flow of colostrum. Attempted with #20NS and without nipple shield. Baby was sleepy and sucked a few times and fell back asleep. Encouraged mother to continue to pump q 3hours.   Continue to give baby volume pumped.  Encouraged STS.  Mom encouraged to feed baby 8-12 times/24 hours and with feeding cues.          Maternal Data    Feeding Feeding Type: Breast Fed  LATCH Score Latch: Too sleepy or reluctant, no latch achieved, no sucking elicited.  Audible Swallowing: None  Type of Nipple: Flat  Comfort (Breast/Nipple): Soft / non-tender  Hold (Positioning): Assistance needed to correctly position infant at breast and maintain latch.  LATCH Score: 4  Interventions    Lactation Tools Discussed/Used     Consult Status      Hardie Pulley 02/18/2018, 12:40 PM

## 2018-02-18 NOTE — Progress Notes (Signed)
Subjective: POD# 1 Information for the patient's newborn:  Erin Watson, [place] [[id number]]  female   Name Erin Watson Circumcision Declines  Reports feeling "really good" Feeding: breast Patient reports tolerating PO and denies N/V.  Breast symptoms:None Pain controlled with PO meds Denies HA/SOB/dizziness.  Flatus present Foley cath D/C'd this AM, void pending Vaginal bleeding is normal, w/o clots. Ambulating w/o difficulty.     Objective:  VS:    Vitals:   02/17/18 2125 02/18/18 0050 02/18/18 0258 02/18/18 0524  BP: (!) 118/56 (!) 110/47  (!) 110/42  Pulse: 76 60 75 63  Resp: 16 20 18 20   Temp: 99 F (37.2 C) 98.9 F (37.2 C)  98.2 F (36.8 C)  TempSrc: Oral Oral  Oral  SpO2: 98% 100% 97% 99%  Weight:      Height:          Intake/Output Summary (Last 24 hours) at 02/18/2018 0939 Last data filed at 02/18/2018 0524 Gross per 24 hour  Intake 3327.29 ml  Output 2992 ml  Net 335.29 ml       Recent Labs    02/17/18 0808 02/18/18 0605  WBC 9.4 15.6*  HGB 11.4* 10.5*  HCT 35.2* 31.8*  PLT 212 236    Blood type: --/--/O POS, O POS Performed at [facility], [address], [place], [place] [address]  (11/08 1914) Rubella: 0.99 (05/30 1131)    Physical Exam:  General: alert, cooperative and no distress CV: Regular rate and rhythm or without murmur or extra heart sounds Resp: clear Abdomen: soft, nontender, hypoactive bowel sounds Incision: clean, dry, intact and Pressure dressing Uterine Fundus: firm, below umbilicus, nontender Lochia: minimal Ext: no edema, redness or tenderness in BLE   Assessment/Plan: 22 y.o.   POD# 1. [id number]                  Active Problems:   Breech presentation   Status post cesarean delivery Post-op pain managed w/ PO meds Breastfeeding     -inexperienced  Routine post-op PP care          Advance diet as tolerated Advised warm fluids and ambulation to improve GI motility Encourage rest when baby rests Breastfeeding  support Anticipate D/C tomorrow if breastfeeding is established  Roma Schanz, SNM 02/18/2018, 9:39 AM

## 2018-02-18 NOTE — Anesthesia Postprocedure Evaluation (Signed)
Anesthesia Post Note  Patient: Erin Watson  Procedure(s) Performed: CESAREAN SECTION (N/A )     Patient location during evaluation: Mother Baby Anesthesia Type: Spinal Level of consciousness: oriented and awake and alert Pain management: pain level controlled Vital Signs Assessment: post-procedure vital signs reviewed and stable Respiratory status: spontaneous breathing and respiratory function stable Cardiovascular status: stable Postop Assessment: no headache, no backache, no apparent nausea or vomiting, patient able to bend at knees, adequate PO intake, spinal receding and able to ambulate Anesthetic complications: no    Last Vitals:  Vitals:   02/18/18 0258 02/18/18 0524  BP:  (!) 110/42  Pulse: 75 63  Resp: 18 20  Temp:  36.8 C  SpO2: 97% 99%    Last Pain:  Vitals:   02/18/18 0524  TempSrc: Oral  PainSc: 3    Pain Goal: Patients Stated Pain Goal: 3 (02/18/18 0524)               Laban Emperor

## 2018-02-18 NOTE — Addendum Note (Signed)
Addendum  created 02/18/18 0838 by Kashira Behunin H, CRNA   Sign clinical note    

## 2018-02-19 ENCOUNTER — Encounter (HOSPITAL_COMMUNITY): Payer: Self-pay | Admitting: *Deleted

## 2018-02-19 MED ORDER — OXYCODONE HCL 5 MG PO TABS: 5.0000 mg | ORAL_TABLET | Freq: Four times a day (QID) | ORAL | 0 refills | Status: AC | PRN

## 2018-02-19 MED ORDER — IBUPROFEN 600 MG PO TABS: 600.0000 mg | ORAL_TABLET | Freq: Four times a day (QID) | ORAL | 0 refills | Status: DC

## 2018-02-19 MED ORDER — SENNOSIDES-DOCUSATE SODIUM 8.6-50 MG PO TABS: 2.0000 | ORAL_TABLET | Freq: Every day | ORAL | 0 refills | Status: DC

## 2018-02-19 MED ORDER — POLYETHYLENE GLYCOL 3350 17 G PO PACK: 17.0000 g | PACK | Freq: Every day | ORAL | 0 refills | Status: DC

## 2018-02-19 NOTE — Discharge Summary (Signed)
Obstetrics Discharge Summary OB/[facility]N [facility]   Patient Name: Erin Watson DOB: May 06, 1995 MRN: [id number]  Date of admission: 02/17/2018 Delivering MD: Guss Bunde   Date of discharge: 02/19/2018  Admitting diagnosis: verison 37wks  Intrauterine pregnancy: [redacted]w[redacted]d    Secondary diagnosis:   Active Problems:   Rubella non-immune status, antepartum   Gestational hypertension   Breech presentation   Status post cesarean delivery  Additional problems:  . Pyelonephritis in pregnancy      Discharge diagnosis: Term pregnancy with Cesarean section delivery               Postpartum procedures: MMR ordered to be given postpartum  Complications: none  Outpatient Follow-Up [ ]  ensure MMR given postpartum [ ]  follow-up on birth control - patient states planning for partner vasectomy  [ ]  BP follow-up    Hospital course: Erin SAYEGHis a 22y.o. 321w2dho was admitted for delivery given gestational hypertension and required Cesarean section after failed version for breech presentation. Her pregnancy was complicated by the above noted items including gestational hypertension with elevated cord dopplers. Uncomplicated Cesarean section with EBL of about 500cc.  Please see delivery/op note for additional details. Her postpartum course was uncomplicated. Her blood pressures were well-controlled, and she did not require any prn antihypertensives. She was breastfeeding without difficulty. By day of discharge, she was passing flatus, urinating, eating and drinking without difficulty. Her pain was well-controlled, and she was discharged home with ibuprofen and oxycodone [id number] prn #20 as well as a bowel regimen. She will follow-up in clinic in 1 week for an incision and blood pressure check and in 4-6 weeks for her routine postpartum visit.   Physical exam  Vitals:   02/18/18 0938 02/18/18 1513 02/18/18 2159 02/19/18 0555  BP: (!) 101/40 124/62 123/74 (!) 126/59  Pulse: 76 90 71  70  Resp: 19 18 17 16   Temp: 97.7 F (36.5 C) 97.6 F (36.4 C) 98 F (36.7 C) 98 F (36.7 C)  TempSrc:   Oral Oral  SpO2: 100% 100%  100%  Weight:      Height:       General: well-appearing, NAD Lochia: appropriate Uterine Fundus: firm Incision: healing well without significant drainage  honeycomb dressing in place with minimal amount of dried blood beneath bandage DVT Evaluation: No significant calf/ankle edema. Labs: Lab Results  Component Value Date   WBC 15.6 (H) 02/18/2018   HGB 10.5 (L) 02/18/2018   HCT 31.8 (L) 02/18/2018   MCV 86.6 02/18/2018   PLT 236 02/18/2018   CMP Latest Ref Rng & Units 02/17/2018  Glucose 70 - 99 mg/dL -  BUN 6 - 20 mg/dL -  Creatinine 0.44 - 1.00 mg/dL 0.56  Sodium 135 - 145 mmol/L -  Potassium 3.5 - 5.1 mmol/L -  Chloride 98 - 111 mmol/L -  CO2 22 - 32 mmol/L -  Calcium 8.9 - 10.3 mg/dL -  Total Protein 6.5 - 8.1 g/dL -  Total Bilirubin 0.3 - 1.2 mg/dL -  Alkaline Phos 38 - 126 U/L -  AST 15 - 41 U/L -  ALT 0 - 44 U/L -    Discharge instructions: Per After Visit Summary and "Baby and Me Booklet"  After visit meds:  Allergies as of 02/19/2018   No Known Allergies     Medication List    TAKE these medications   ibuprofen 600 MG tablet Commonly known as:  ADVIL,MOTRIN Take 1 tablet (600 mg  total) by mouth every 6 (six) hours.   multivitamin-prenatal 27-0.8 MG Tabs tablet Take 1 tablet by mouth daily at 12 noon.   oxyCODONE 5 MG immediate release tablet Commonly known as:  Oxy IR/ROXICODONE Take 1 tablet (5 mg total) by mouth every 6 (six) hours as needed for up to 5 days for severe pain.   polyethylene glycol packet Commonly known as:  MIRALAX / GLYCOLAX Take 17 g by mouth daily. for constipation   senna-docusate 8.6-50 MG tablet Commonly known as:  Senokot-S Take 2 tablets by mouth at bedtime.       Postpartum contraception: Vasectomy Diet: Routine Diet Activity: Advance as tolerated. Pelvic rest for 6 weeks.    Outpatient follow up:1 week for BP and incision check, 4-6 weeks for postpartum visit Follow-up Appt: Future Appointments  Date Time Provider Sac  02/27/2018 10:30 AM [facility] NURSE TECH [facility]   Newborn Data: Live born female  Birth Weight: 6 lb 4.2 oz (2840 g) APGAR: 9, 10  Newborn Delivery   Birth date/time:  02/17/2018 14:00:00 Delivery type:  C-Section, Low Transverse Trial of labor:  No C-section categorization:  Primary    Baby Feeding: Breast Disposition:home with mother  Lambert Mody. Juleen China, DO OB/GYN Fellow, Faculty Practice

## 2018-02-19 NOTE — Lactation Note (Signed)
This note was copied from a baby's chart. Lactation Consultation Note; Mom reports breast feeding is going much better today. Continues using NS and reports he is latching on better. Milk volume is beginning to increase. Mom reports left breast was engorged but she has just pumped after nursing and obtained 10 ml of transitional milk. Reports breast feels better. Ice packs given to mom to apply to breast. Reviewed engorgement prevention and treatment. Has Medela pump for home. Probable DC today. No questions at present. Pleased it is going better. Reviewed our phone number, OP appointments and BFSG as resources for support after DC. Encouraged to continue trying to latch baby without NS and to make OP appointment if needs assist. To call prn   Patient Name: Erin Watson [id number] Date: 02/19/2018 Reason for consult: Follow-up assessment;Early term 37-38.6wks   Maternal Data Formula Feeding for Exclusion: No Has patient been taught Hand Expression?: Yes Does the patient have breastfeeding experience prior to this delivery?: No  Feeding Feeding Type: Breast Fed  LATCH Score Latch: Grasps breast easily, tongue down, lips flanged, rhythmical sucking.  Audible Swallowing: A few with stimulation(while massaging milk into NS)  Type of Nipple: Everted at rest and after stimulation(short)  Comfort (Breast/Nipple): Soft / non-tender  Hold (Positioning): Assistance needed to correctly position infant at breast and maintain latch.  LATCH Score: 8  Interventions    Lactation Tools Discussed/Used Tools: Nipple [facility] Program: No   Consult Status Consult Status: Complete    Pamelia Hoit 02/19/2018, 10:40 AM

## 2018-02-19 NOTE — Lactation Note (Signed)
This note was copied from a baby's chart. Lactation Consultation Note  Patient Name: Erin Watson Date: 02/19/2018 Reason for consult: Mother's request;Difficult latch;1st time breastfeeding;Early term 37-38.6wks P1, 35 hour female infant, c/s and weight loss -2% BF concerns: ETI, infant was not  latching to breast, mom has short shaft nipples and given 20 mm NS.  Per parents,  infant has not been latching to breast. Mom has been hand expressing and using DEBP to feed infant EBM from 5 french tube. LC ask mom do nipple roll and stretch NS and mom expressed small amount colostrum in NS  prior to latching infant on left breast,  Using the  cross cradle hold. LC flange top lip out and extended lower jaw for more depth, infant in rhythmic  sucking pattern and sustained latch.  Infant was breastfeeding  for 12 minutes, still BF as LC left room.  Parents were excited that infant latched and sustained latch. Mom will pump after feeding and give infant back EBM reinforced feeding guidelines ETI and previous LC recommendations.  Mom's current feeding plan: 1. Mom will BF according hunger cues, 8 to 12 times within 24 hours including nights. Will wake baby not go longer than 3 hours without feeding infant. 2. Mom will call Nurse or LC if need assistance with latching infant to breast. 3.  Mom will do nipple roll or pre-pump prior to applying NS before latching infant to breast.  4. Mom will pump and/ or hand express and give infant back according to  age 62-to 35 hours ( 10-20 ml of EBM) Maternal Data    Feeding Feeding Type: Breast Fed  LATCH Score Latch: Grasps breast easily, tongue down, lips flanged, rhythmical sucking.  Audible Swallowing: Spontaneous and intermittent  Type of Nipple: Everted at rest and after stimulation(short shafted )  Comfort (Breast/Nipple): Soft / non-tender  Hold (Positioning): Assistance needed to correctly position infant at breast and maintain  latch.  LATCH Score: 9  Interventions Interventions: Assisted with latch;Adjust position;Breast compression;Support pillows;Position options;Skin to skin;DEBP  Lactation Tools Discussed/Used     Consult Status Consult Status: Follow-up Date: 02/19/18 Follow-up type: In-patient    Danelle Earthly 02/19/2018, 1:29 AM

## 2018-02-20 NOTE — Telephone Encounter (Signed)
Message sent twice in error

## 2018-02-22 ENCOUNTER — Telehealth: Payer: Self-pay | Admitting: *Deleted

## 2018-02-22 NOTE — Telephone Encounter (Signed)
Patient states she has noticed a small speck of bright red blood and dark brown blood on her honeycomb dressing.  She has been moving around a little more today as well. . Informed patient she may notice some dark brown blood but if bleeding starts to saturate dressing or get bigger, to call our office. Verbalized understanding.

## 2018-02-23 ENCOUNTER — Other Ambulatory Visit: Payer: Self-pay | Admitting: Advanced Practice Midwife

## 2018-02-24 ENCOUNTER — Ambulatory Visit (INDEPENDENT_AMBULATORY_CARE_PROVIDER_SITE_OTHER): Payer: Medicaid Other | Admitting: Obstetrics & Gynecology

## 2018-02-24 ENCOUNTER — Encounter: Payer: Self-pay | Admitting: Obstetrics & Gynecology

## 2018-02-24 VITALS — BP 137/82 | HR 88 | Ht 63.0 in | Wt 146.0 lb

## 2018-02-24 DIAGNOSIS — Z98891 History of uterine scar from previous surgery: Secondary | ICD-10-CM

## 2018-02-24 NOTE — Progress Notes (Signed)
  HPI: Patient returns for routine postoperative follow-up having undergone primary C section on 02/17/2018.  The patient's immediate postoperative recovery has been unremarkable. Since hospital discharge the patient reports doing well.   Current Outpatient Medications: ibuprofen (ADVIL,MOTRIN) 600 MG tablet, Take 1 tablet (600 mg total) by mouth every 6 (six) hours., Disp: 30 tablet, Rfl: 0 oxyCODONE (OXY IR/ROXICODONE) 5 MG immediate release tablet, Take 1 tablet (5 mg total) by mouth every 6 (six) hours as needed for up to 5 days for severe pain., Disp: 20 tablet, Rfl: 0 polyethylene glycol (MIRALAX) packet, Take 17 g by mouth daily. for constipation (Patient not taking: Reported on 02/24/2018), Disp: 14 each, Rfl: 0 Prenatal Vit-Fe Fumarate-FA (MULTIVITAMIN-PRENATAL) 27-0.8 MG TABS tablet, Take 1 tablet by mouth daily at 12 noon., Disp: , Rfl:  senna-docusate (SENOKOT-S) 8.6-50 MG tablet, Take 2 tablets by mouth at bedtime. (Patient not taking: Reported on 02/24/2018), Disp: 10 tablet, Rfl: 0  No current facility-administered medications for this visit.     Blood pressure 137/82, pulse 88, height 5\' 3"  (1.6 m), weight 146 lb (66.2 kg), currently breastfeeding.  Physical Exam: Incision clean dry intact  Diagnostic Tests:   Pathology:   Impression: S/p primary section for breech  Plan:   Follow up: 4  weeks  Lazaro ArmsLuther H Mishawn Didion, MD

## 2018-03-24 ENCOUNTER — Encounter: Payer: Self-pay | Admitting: Adult Health

## 2018-03-24 ENCOUNTER — Other Ambulatory Visit: Payer: Self-pay

## 2018-03-24 ENCOUNTER — Ambulatory Visit (INDEPENDENT_AMBULATORY_CARE_PROVIDER_SITE_OTHER): Payer: Medicaid Other | Admitting: Adult Health

## 2018-03-24 DIAGNOSIS — Z30014 Encounter for initial prescription of intrauterine contraceptive device: Secondary | ICD-10-CM

## 2018-03-24 DIAGNOSIS — Z1331 Encounter for screening for depression: Secondary | ICD-10-CM | POA: Diagnosis not present

## 2018-03-24 NOTE — Progress Notes (Signed)
Patient ID: Erin Watson, female   DOB: May 29, 1995, 22 y.o.   MRN: [id number] Erin Watson is a 22 year old white female,married, in for a postpartum visit. She delivered 5 weeks ago, by C-section for Breech presentation, with failed version, and she had gestational hypertension with elevated dopplers.  Delivery Date: 02/17/2018   Method of Delivery: Primary C/S by Dr Elsie LincolnKelly Leggett It was a baby boy, named [name],   Sexual Activity since delivery: No  Method of Feeding: Breast at first, now bottle  Number of weeks bleeding post delivery: 3-4  Review of Systems: Patient denies any headaches, hearing loss, fatigue, blurred vision, shortness of breath, chest pain, abdominal pain, problems with bowel movements, urination, or intercourse(no sex yet). No joint pain or mood swings.  Reviewed past medical,surgical, social and family history. Reviewed medications and allergies.   Depression Score: 6, she denies depression just blue, esp at night, denies suicidal ideations, if changes let us know  BP 134/83 (BP Location: Right Arm, Patient Position: Sitting, Cuff Size: Normal)   Pulse 75   Ht 5\' 3"  (1.6 m)   Wt 136 lb (61.7 kg)   LMP 03/20/2018   Breastfeeding No   BMI 24.09 kg/m   Skin warm and dry. Neck: mid line trachea, normal thyroid, good ROM, no lymphadenopathy noted. Lungs: clear to ausculation bilaterally. Cardiovascular: regular rate and rhythm. Abdomen is soft and non tender, scar together, no redness or swelling, has stitch on left, it should dissolve. Pelvic Exam:   External genitalia is normal in appearance, no lesions.  The vagina has good color, moisture and rugae, no lesions, +blood.Urethra has no masses or tenderness noted. The cervix is closed.  Uterus is felt to be normal size, shape, and contour, well involuted.  No adnexal masses or tenderness noted.Bladder is non tender, no masses felt. She wants an IUD,no sex, return next week for insertion. Examination chaperoned  by Francene FindersKim Lancaster RN.    Impression:  1. Postpartum examination following cesarean delivery   2. Encounter for initial prescription of intrauterine contraceptive device (IUD)   3. Depression screening      Plan:   Review handout on IUD No sex Return 12/17 for IUD insertion with Dr Emelda FearFerguson

## 2018-03-28 ENCOUNTER — Encounter: Payer: Self-pay | Admitting: Obstetrics and Gynecology

## 2018-03-28 ENCOUNTER — Ambulatory Visit (INDEPENDENT_AMBULATORY_CARE_PROVIDER_SITE_OTHER): Payer: Medicaid Other | Admitting: Obstetrics and Gynecology

## 2018-03-28 VITALS — BP 125/78 | HR 73 | Ht 63.0 in | Wt 136.0 lb

## 2018-03-28 DIAGNOSIS — Z30430 Encounter for insertion of intrauterine contraceptive device: Secondary | ICD-10-CM | POA: Diagnosis not present

## 2018-03-28 DIAGNOSIS — Z3202 Encounter for pregnancy test, result negative: Secondary | ICD-10-CM

## 2018-03-28 LAB — POCT URINE PREGNANCY: Preg Test, Ur: NEGATIVE

## 2018-03-28 MED ORDER — LEVONORGESTREL 19.5 MCG/DAY IU IUD
INTRAUTERINE_SYSTEM | Freq: Once | INTRAUTERINE | Status: AC
  Administered 2018-03-29: 15:00:00 via INTRAUTERINE

## 2018-03-28 NOTE — Progress Notes (Signed)
  GYNECOLOGY CLINIC PROCEDURE NOTE  Erin Watson is a 22 y.o. G2P0011 here for Liletta IUD insertion.  No GYN concerns.  Last pap smear was on 09/08/17 and was normal.  IUD Insertion Procedure Note Patient identified, informed consent performed, consent signed.   Discussed risks of irregular bleeding, cramping, infection, malpositioning or misplacement of the IUD outside the uterus which may require further procedure such as laparoscopy. Time out was performed.  Urine pregnancy test negative.  Speculum placed in the vagina.  Cervix visualized.  Cleaned with Betadine x 2.  Grasped anteriorly with a single tooth tenaculum.   Uterus sounded to 8.5 cm.   IUD placed per manufacturer's recommendations.  Strings trimmed to 3 cm. Tenaculum was removed, good hemostasis noted.  Patient tolerated procedure well.  Transvaginal Ultrasound was used to confirm IUD position, and it appears to be properly located at the uterine fundus.in the endometrial cavity.  Patient was given post-procedure instructions.  She was advised to have backup contraception for one week.  Patient was also asked to check IUD strings periodically.  Follow up in 4 weeks for IUD check.    Tilda BurrowJohn V Wanda Rideout, MD Attending Obstetrician & Gynecologist [facility]

## 2018-03-29 MED ORDER — LEVONORGESTREL 19.5 MCG/DAY IU IUD: INTRAUTERINE_SYSTEM | Freq: Once | INTRAUTERINE | Status: DC

## 2018-03-29 NOTE — Addendum Note (Signed)
Addended by: Federico FlakeNES, Kier Smead A on: 03/29/2018 03:01 PM   Modules accepted: Orders

## 2018-05-02 ENCOUNTER — Encounter: Payer: Self-pay | Admitting: Obstetrics and Gynecology

## 2018-05-02 ENCOUNTER — Ambulatory Visit (INDEPENDENT_AMBULATORY_CARE_PROVIDER_SITE_OTHER): Payer: Medicaid Other | Admitting: Obstetrics and Gynecology

## 2018-05-02 VITALS — BP 127/72 | HR 85 | Ht 63.0 in | Wt 139.0 lb

## 2018-05-02 DIAGNOSIS — Z30431 Encounter for routine checking of intrauterine contraceptive device: Secondary | ICD-10-CM | POA: Diagnosis not present

## 2018-05-02 NOTE — Progress Notes (Signed)
Patient ID: Erin Watson, female   DOB: 1995-08-28, 23 y.o.   MRN: [id number]   GYNECOLOGY OFFICE PROGRESS NOTE  History:  23 y.o. G2P0011 here today for today for IUD string check; Liletta type of IUD was placed on  03/29/2019. No complaints about the IUD, no concerning side effects. She denies any spotting or bleeding.  The following portions of the patient's history were reviewed and updated as appropriate: allergies, current medications, past family history, past medical history, past social history, past surgical history and problem list.  Review of Systems:  Pertinent items are noted in HPI.   Objective:  Physical Exam There were no vitals taken for this visit. CONSTITUTIONAL: Well-developed, well-nourished female in no acute distress.  HENT:  Normocephalic, atraumatic. External right and left ear normal. Oropharynx is clear and moist EYES: Conjunctivae and EOM are normal. Pupils are equal, round, and reactive to light. No scleral icterus.  NECK: Normal range of motion, supple, no masses CARDIOVASCULAR: Normal heart rate noted RESPIRATORY: Effort and breath sounds normal, no problems with respiration noted ABDOMEN: Soft, no distention noted.   PELVIC: Normal appearing external genitalia; normal appearing vaginal mucosa and cervix.  IUD strings visualized at top of vagina 7 cm inside, about 1.5 cm in length outside cervix.  Ultrasound was used to reassess iud position. Assessment & Plan:  1. Normal IUD check.  P: 1. Patient to keep IUD in place for five years; can come in for removal if she desires pregnancy within the next five years. 2. Routine preventative health maintenance measures emphasized.   By signing my name below, I, Arnette Norris, attest that this documentation has been prepared under the direction and in the presence of Tilda Burrow, MD. Electronically Signed: Arnette Norris Medical Scribe. 05/02/18. 1:26 PM.  I personally performed the services described in  this documentation, which was SCRIBED in my presence. The recorded information has been reviewed and considered accurate. It has been edited as necessary during review. Tilda Burrow, MD

## 2019-02-11 IMAGING — US US OB TRANSVAGINAL
1 series · 14 of 28 positions shown · non-contrast
Comparison: None.

CLINICAL DATA: Right lower quadrant pain

EXAM:
OBSTETRIC <14 WK US AND TRANSVAGINAL OB US
TECHNIQUE: Both transabdominal and transvaginal ultrasound examinations were
performed for complete evaluation of the gestation as well as the
maternal uterus, adnexal regions, and pelvic cul-de-sac.
Transvaginal technique was performed to assess early pregnancy.

[Series 1: us ob transvaginal · 0.15mm/px · 14 of 106 slices shown]
[im 4/106]
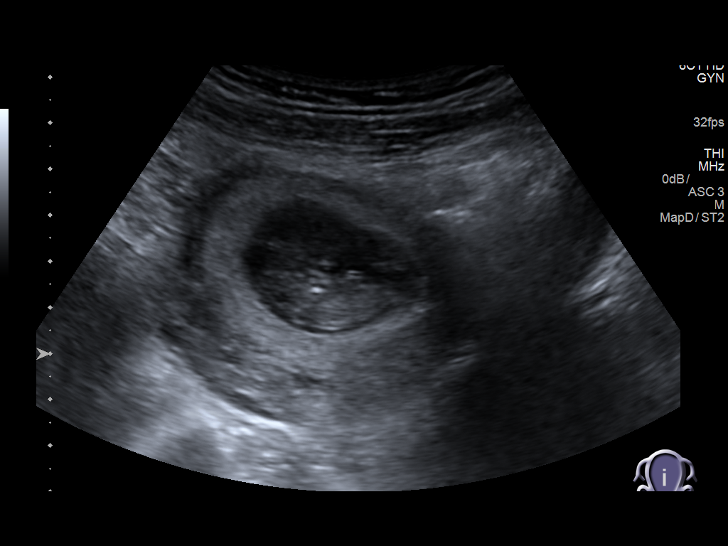
[im 12/106]
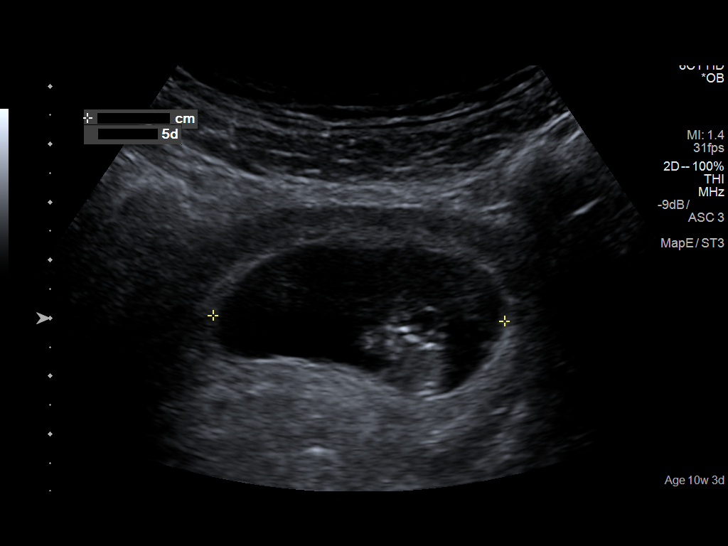
[im 20/106]
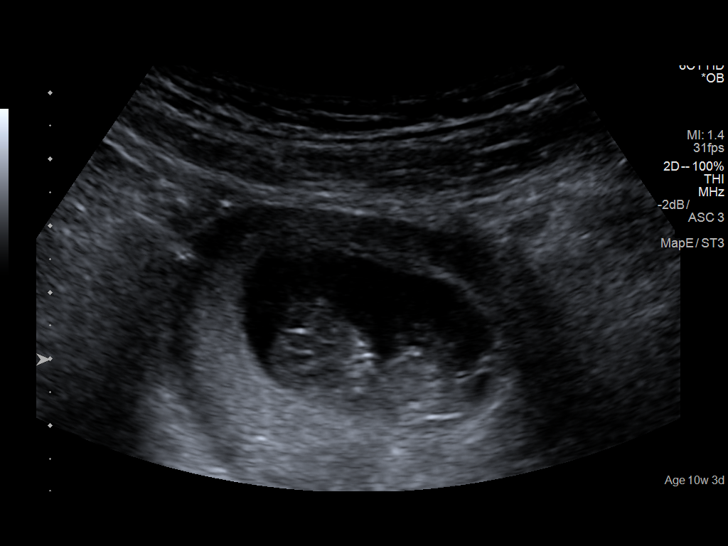
[im 28/106]
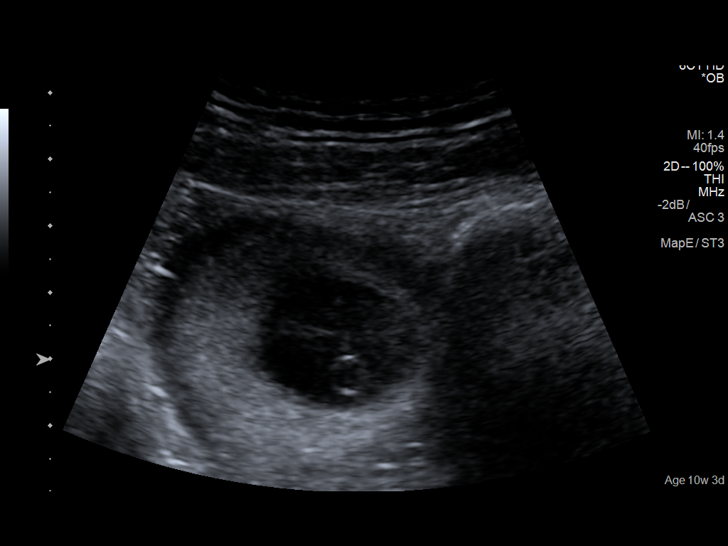
[im 36/106]
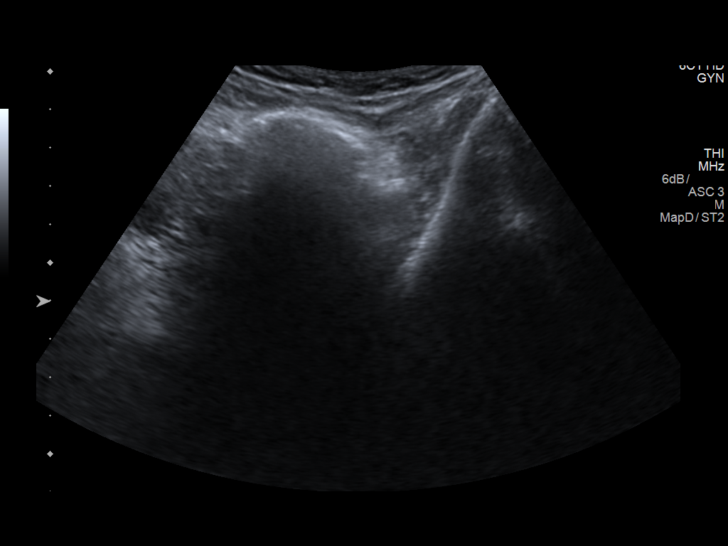
[im 43/106]
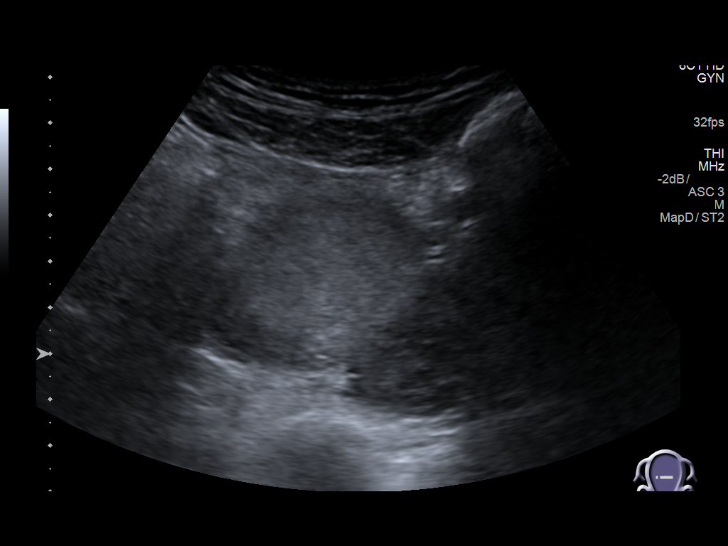
[im 51/106]
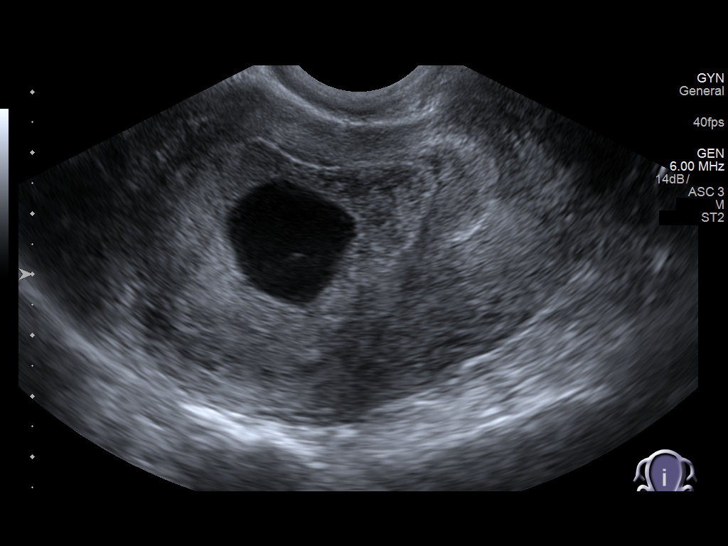
[im 59/106]
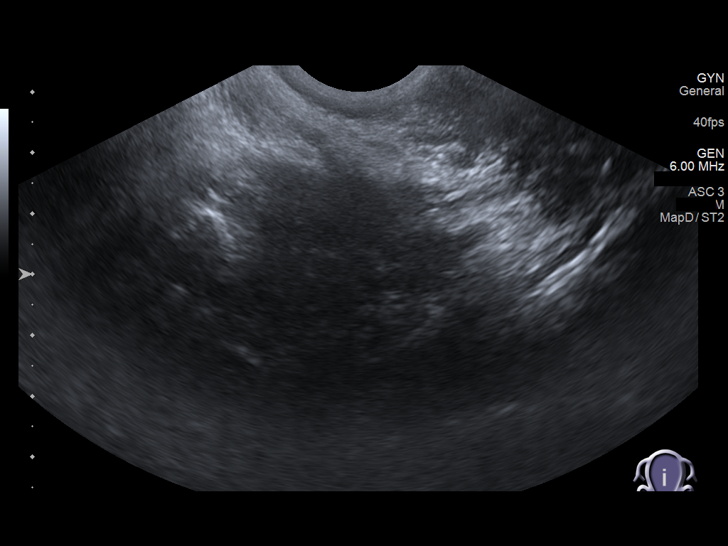
[im 67/106]
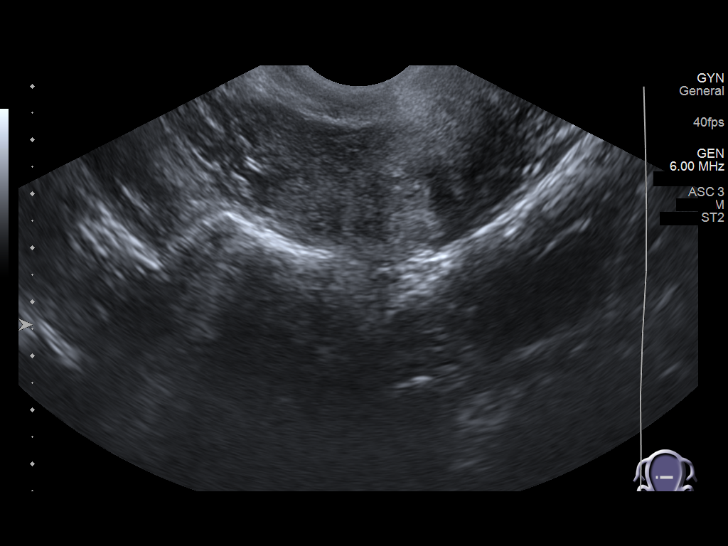
[im 74/106]
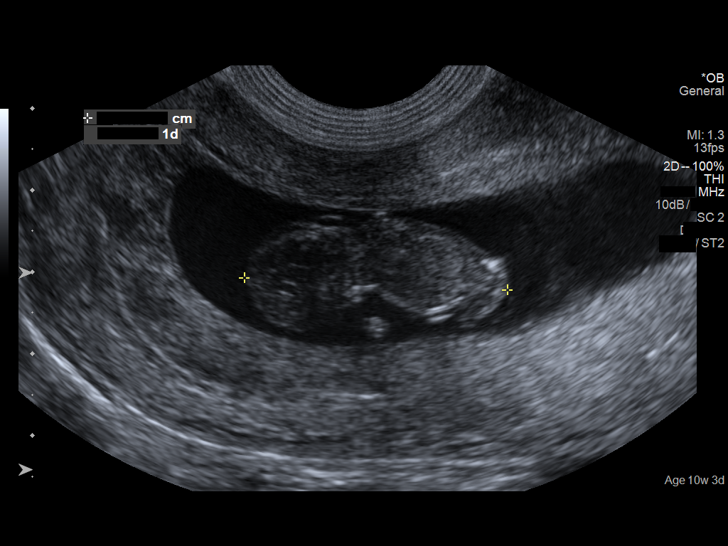
[im 82/106]
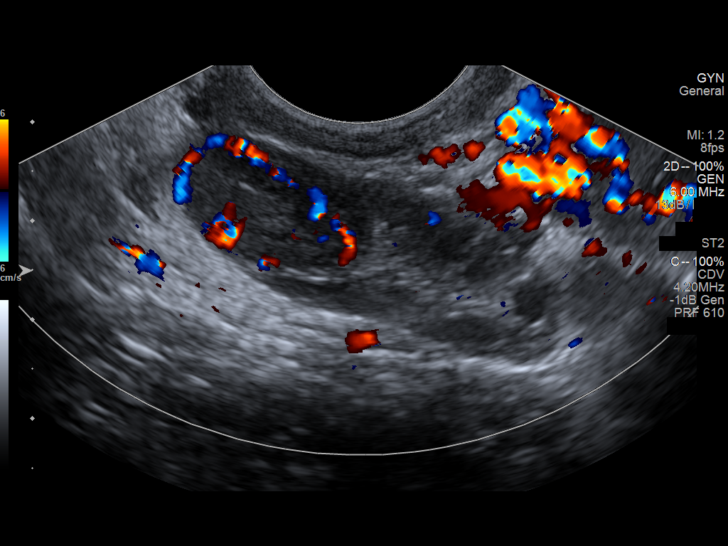
[im 90/106]
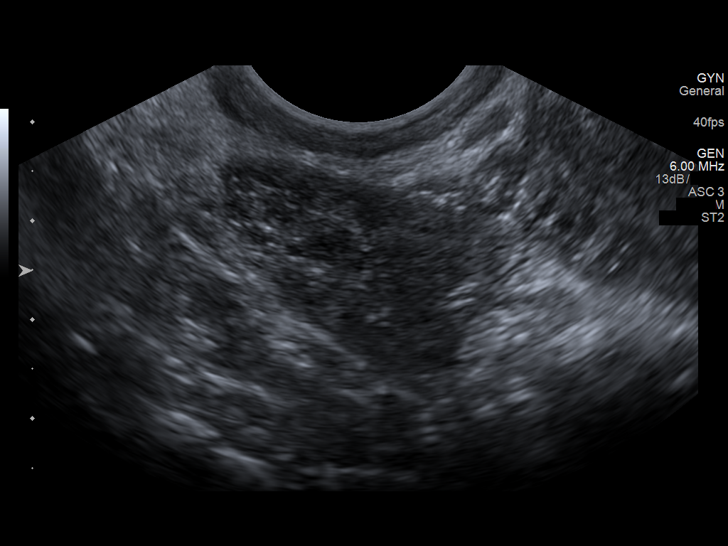
[im 98/106]
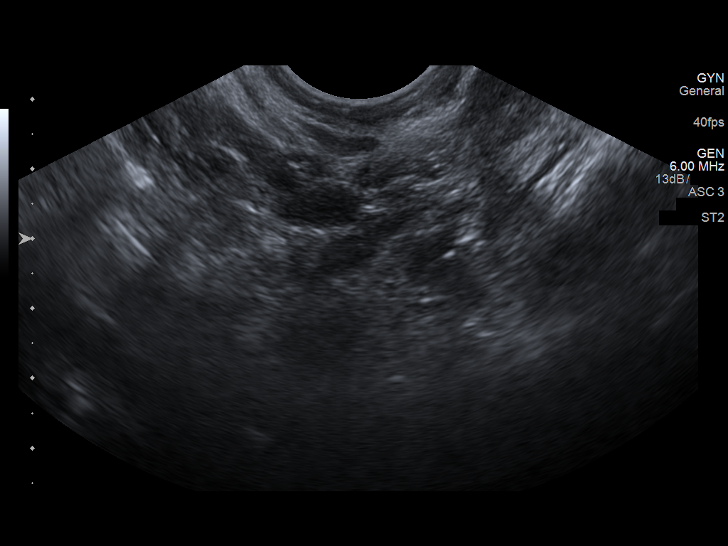
[im 106/106]
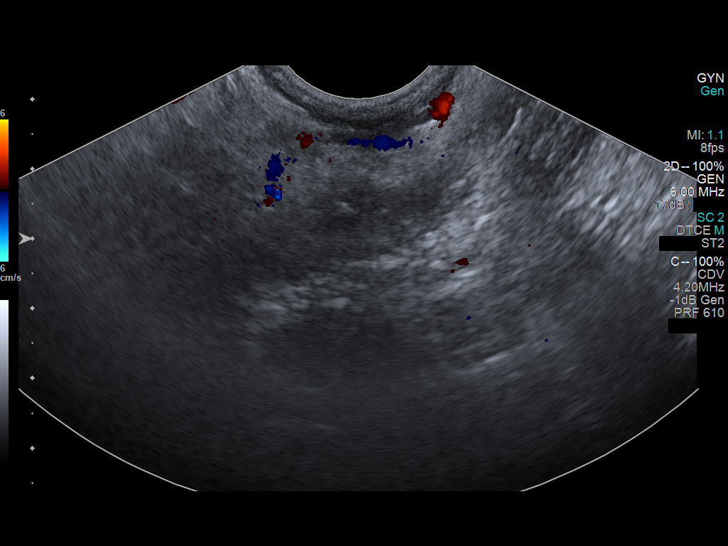

[14 of 28 positions shown; findings below may reference images not displayed]

FINDINGS: Intrauterine gestational sac: Single

Yolk sac:  Visualized.

Embryo:  Visualized.

Cardiac Activity: Visualized.

Heart Rate: 163 bpm

MSD:   mm    w     d

CRL:  31.2 mm   10 w   0 d                  US EDC: March 13, 2018

Subchorionic hemorrhage:  None visualized.

Maternal uterus/adnexae: Corpus luteum cyst on the right. Left ovary
not visualized.
IMPRESSION: 1. Single live IUP.
2. Corpus luteum cyst in the right ovary.

## 2019-03-22 ENCOUNTER — Other Ambulatory Visit: Payer: Self-pay

## 2019-03-22 DIAGNOSIS — Z20822 Contact with and (suspected) exposure to covid-19: Secondary | ICD-10-CM

## 2019-03-23 LAB — NOVEL CORONAVIRUS, NAA: SARS-CoV-2, NAA: NOT DETECTED

## 2019-04-19 ENCOUNTER — Ambulatory Visit: Payer: Self-pay | Attending: Internal Medicine

## 2019-04-19 ENCOUNTER — Other Ambulatory Visit: Payer: Self-pay

## 2019-04-19 DIAGNOSIS — Z20822 Contact with and (suspected) exposure to covid-19: Secondary | ICD-10-CM | POA: Insufficient documentation

## 2019-04-21 LAB — NOVEL CORONAVIRUS, NAA: SARS-CoV-2, NAA: NOT DETECTED

## 2019-04-23 ENCOUNTER — Other Ambulatory Visit: Payer: Self-pay

## 2019-04-23 ENCOUNTER — Ambulatory Visit: Payer: Medicaid Other | Attending: Internal Medicine

## 2019-04-23 DIAGNOSIS — U071 COVID-19: Secondary | ICD-10-CM | POA: Insufficient documentation

## 2019-04-23 DIAGNOSIS — Z20822 Contact with and (suspected) exposure to covid-19: Secondary | ICD-10-CM

## 2019-04-24 LAB — NOVEL CORONAVIRUS, NAA: SARS-CoV-2, NAA: DETECTED — AB

## 2020-05-13 ENCOUNTER — Ambulatory Visit: Payer: Self-pay | Admitting: Women's Health

## 2021-12-15 ENCOUNTER — Encounter: Payer: Self-pay | Admitting: Radiology

## 2021-12-24 ENCOUNTER — Other Ambulatory Visit (HOSPITAL_COMMUNITY)
Admission: RE | Admit: 2021-12-24 | Discharge: 2021-12-24 | Disposition: A | Discharge status: Home / Self Care | Payer: BC Managed Care – PPO | Source: Ambulatory Visit | Attending: Obstetrics & Gynecology | Admitting: Obstetrics & Gynecology

## 2021-12-24 ENCOUNTER — Encounter: Payer: Self-pay | Admitting: Obstetrics & Gynecology

## 2021-12-24 ENCOUNTER — Ambulatory Visit (INDEPENDENT_AMBULATORY_CARE_PROVIDER_SITE_OTHER): Payer: BC Managed Care – PPO | Admitting: Obstetrics & Gynecology

## 2021-12-24 VITALS — BP 118/78 | HR 62 | Ht 63.0 in | Wt 157.0 lb

## 2021-12-24 DIAGNOSIS — Z01419 Encounter for gynecological examination (general) (routine) without abnormal findings: Secondary | ICD-10-CM | POA: Diagnosis not present

## 2021-12-24 DIAGNOSIS — Z30432 Encounter for removal of intrauterine contraceptive device: Secondary | ICD-10-CM | POA: Diagnosis not present

## 2021-12-24 NOTE — Progress Notes (Signed)
WELL-WOMAN EXAMINATION Patient name: Erin Watson MRN [id number]  Date of birth: [birth year] Chief Complaint:   Gynecologic Exam  History of Present Illness:   Erin Watson is a 26 y.o. G46P0011  female being seen today for a routine well-woman exam.   Desires IUD removal as she desires another pregnancy Denies vaginal discharge, itching or irritation.  Denies pelvic or abdominal pain.  No acute complaints   No LMP recorded. (Menstrual status: IUD).  Last pap 2020.  Last mammogram: n/a. Last colonoscopy: n/a     09/08/2017   10:35 AM 04/20/2016    2:00 PM  Depression screen PHQ 2/9  Decreased Interest 1 1  Down, Depressed, Hopeless 0 1  PHQ - 2 Score 1 2  Altered sleeping 0 0  Tired, decreased energy 1 1  Change in appetite 0 0  Feeling bad or failure about yourself  0 1  Trouble concentrating 0 1  Moving slowly or fidgety/restless 0 0  Suicidal thoughts 0 0  PHQ-9 Score 2 5  Difficult doing work/chores Not difficult at all Not difficult at all      Review of Systems:   Pertinent items are noted in HPI Denies any headaches, blurred vision, fatigue, shortness of breath, chest pain, abdominal pain, bowel movements, urination, or intercourse unless otherwise stated above.  Pertinent History Reviewed:  Reviewed past medical,surgical, social and family history.  Reviewed problem list, medications and allergies. Physical Assessment:   Vitals:   12/24/21 1047  BP: 118/78  Pulse: 62  Weight: 157 lb (71.2 kg)  Height: 5\' 3"  (1.6 m)  Body mass index is 27.81 kg/m.        Physical Examination:   General appearance - well appearing, and in no distress  Mental status - alert, oriented to person, place, and time  Psych:  She has a normal mood and affect  Skin - warm and dry, normal color, no suspicious lesions noted  Chest - effort normal, all lung fields clear to auscultation bilaterally  Heart - normal rate and regular rhythm  Neck:  midline trachea, no  thyromegaly or nodules  Breasts - breasts appear normal, no suspicious masses, no skin or nipple changes or  axillary nodes  Abdomen - soft, nontender, nondistended, no masses or organomegaly  Pelvic - VULVA: normal appearing vulva with no masses, tenderness or lesions  VAGINA: normal appearing vagina with normal color and discharge, no lesions  CERVIX: normal appearing cervix without discharge or lesions, no CMT, strings visualized at os  Thin prep pap is done with HR HPV cotesting  UTERUS: uterus is felt to be normal size, shape, consistency and nontender   ADNEXA: No adnexal masses or tenderness noted.   Extremities:  No swelling or varicosities noted  Chaperone:  pt declined      IUD REMOVAL   Time out was performed.  A sterile speculum was placed in the vagina.  The cervix was visualized, and the strings visible. They were grasped and the Liletta  IUD was easily removed intact without complications. The patient tolerated the procedure well.     Assessment & Plan:  1) Well-Woman Exam -pap collected, reviewed ASCCP guidelines  2) IUD removal, desires pregnancy Encouraged PNV dail "Heatlhy you" Follow up once positive pregnancy test   Meds: PNV    Follow-up: Return in about 1 year (around 12/25/2022) for Annual.   12/27/2022, DO Attending Obstetrician & Gynecologist, [facility], [facility]

## 2021-12-29 LAB — CYTOLOGY - PAP
Chlamydia: NEGATIVE
Comment: NEGATIVE
Comment: NEGATIVE
Comment: NORMAL
Diagnosis: NEGATIVE
High risk HPV: NEGATIVE
Neisseria Gonorrhea: NEGATIVE

## 2022-12-13 ENCOUNTER — Other Ambulatory Visit: Payer: Self-pay

## 2022-12-13 ENCOUNTER — Encounter (HOSPITAL_COMMUNITY): Payer: Self-pay

## 2022-12-13 ENCOUNTER — Emergency Department (HOSPITAL_COMMUNITY)
Admission: EM | Admit: 2022-12-13 | Discharge: 2022-12-13 | Disposition: A | Discharge status: Home / Self Care | Payer: BC Managed Care – PPO | Attending: Emergency Medicine | Admitting: Emergency Medicine

## 2022-12-13 DIAGNOSIS — S70362A Insect bite (nonvenomous), left thigh, initial encounter: Secondary | ICD-10-CM | POA: Insufficient documentation

## 2022-12-13 DIAGNOSIS — T7840XA Allergy, unspecified, initial encounter: Secondary | ICD-10-CM | POA: Insufficient documentation

## 2022-12-13 DIAGNOSIS — W57XXXA Bitten or stung by nonvenomous insect and other nonvenomous arthropods, initial encounter: Secondary | ICD-10-CM | POA: Insufficient documentation

## 2022-12-13 MED ORDER — PREDNISONE 20 MG PO TABS: ORAL_TABLET | ORAL | 0 refills | Status: AC

## 2022-12-13 MED ORDER — METHYLPREDNISOLONE SODIUM SUCC 125 MG IJ SOLR
125.0000 mg | Freq: Once | INTRAMUSCULAR | Status: AC
  Administered 2022-12-13: 125 mg via INTRAMUSCULAR
  Filled 2022-12-13: qty 2

## 2022-12-13 NOTE — ED Triage Notes (Signed)
Pt got bitten by some kind of insect on sat. Pt stated she did not have a stinger or anything left behind. Back of left thigh is red, swollen and warm. Unable to assess a spot for a bite. Area 15cm x 23cm red area. Pt has been taking benadryl every 4-6 hrs while awake.

## 2022-12-13 NOTE — ED Provider Notes (Signed)
   EMERGENCY DEPARTMENT AT [facility] Provider Note   CSN: [id number] Arrival date & time: 12/13/22  1040     History {Add pertinent medical, surgical, social history, OB history to HPI:1} Chief Complaint  Patient presents with   Insect Bite   Rash    CARIL LINGE is a 27 y.o. female.  Patient was bit or stung by an insect 2 days ago and continues to have a rash in her left thigh   Rash      Home Medications Prior to Admission medications   Medication Sig Start Date End Date Taking? Authorizing Provider  predniSONE (DELTASONE) 20 MG tablet 2 tabs po daily x 3 days 12/13/22  Yes Bethann Berkshire, MD      Allergies    Patient has no known allergies.    Review of Systems   Review of Systems  Skin:  Positive for rash.    Physical Exam Updated Vital Signs BP 126/71   Pulse 98   Temp 97.7 F (36.5 C) (Oral)   Resp 17   Ht 5\' 3"  (1.6 m)   Wt 63.5 kg   LMP 12/13/2022 (Exact Date)   SpO2 100%   BMI 24.80 kg/m  Physical Exam  ED Results / Procedures / Treatments   Labs (all labs ordered are listed, but only abnormal results are displayed) Labs Reviewed - No data to display  EKG None  Radiology No results found.  Procedures Procedures  {Document cardiac monitor, telemetry assessment procedure when appropriate:1}  Medications Ordered in ED Medications  methylPREDNISolone sodium succinate (SOLU-MEDROL) 125 mg/2 mL injection 125 mg (has no administration in time range)    ED Course/ Medical Decision Making/ A&P   {   Click here for ABCD2, HEART and other calculatorsREFRESH Note before signing :1}                              Medical Decision Making Risk Prescription drug management.   Allergic reaction.  Patient given steroids and will continue Benadryl and follow-up as needed  {Document critical care time when appropriate:1} {Document review of labs and clinical decision tools ie heart score, Chads2Vasc2 etc:1}  {Document  your independent review of radiology images, and any outside records:1} {Document your discussion with family members, caretakers, and with consultants:1} {Document social determinants of health affecting pt's care:1} {Document your decision making why or why not admission, treatments were needed:1} Final Clinical Impression(s) / ED Diagnoses Final diagnoses:  Allergic reaction, initial encounter    Rx / DC Orders ED Discharge Orders          Ordered    predniSONE (DELTASONE) 20 MG tablet        12/13/22 1123

## 2022-12-13 NOTE — Discharge Instructions (Signed)
Take Benadryl or Claritin for rash or itching.  Follow-up with your doctor if not improving

## 2024-03-15 ENCOUNTER — Ambulatory Visit: Payer: Self-pay
